# Patient Record
Sex: Female | Born: 1960
Health system: Southern US, Community
[De-identification: ages and names within clinical notes are randomized; demographics above are authoritative.]

## PROBLEM LIST (undated history)

## (undated) DIAGNOSIS — E119 Type 2 diabetes mellitus without complications: Secondary | ICD-10-CM

## (undated) DIAGNOSIS — I10 Essential (primary) hypertension: Secondary | ICD-10-CM

## (undated) DIAGNOSIS — E039 Hypothyroidism, unspecified: Secondary | ICD-10-CM

## (undated) DIAGNOSIS — F988 Other specified behavioral and emotional disorders with onset usually occurring in childhood and adolescence: Secondary | ICD-10-CM

## (undated) DIAGNOSIS — M1990 Unspecified osteoarthritis, unspecified site: Secondary | ICD-10-CM

## (undated) DIAGNOSIS — M5430 Sciatica, unspecified side: Secondary | ICD-10-CM

## (undated) DIAGNOSIS — J45909 Unspecified asthma, uncomplicated: Secondary | ICD-10-CM

## (undated) HISTORY — DX: Type 2 diabetes mellitus without complications: E11.9

## (undated) HISTORY — DX: Essential (primary) hypertension: I10

## (undated) HISTORY — DX: Unspecified asthma, uncomplicated: J45.909

## (undated) HISTORY — PX: KNEE SURGERY: SHX244

## (undated) HISTORY — DX: Unspecified osteoarthritis, unspecified site: M19.90

## (undated) HISTORY — DX: Hypothyroidism, unspecified: E03.9

## (undated) HISTORY — DX: Other specified behavioral and emotional disorders with onset usually occurring in childhood and adolescence: F98.8

---

## 2000-01-05 DIAGNOSIS — F988 Other specified behavioral and emotional disorders with onset usually occurring in childhood and adolescence: Secondary | ICD-10-CM

## 2000-01-05 HISTORY — DX: Other specified behavioral and emotional disorders with onset usually occurring in childhood and adolescence: F98.8

## 2002-01-04 DIAGNOSIS — J45909 Unspecified asthma, uncomplicated: Secondary | ICD-10-CM

## 2002-01-04 HISTORY — DX: Unspecified asthma, uncomplicated: J45.909

## 2004-01-05 DIAGNOSIS — M1990 Unspecified osteoarthritis, unspecified site: Secondary | ICD-10-CM

## 2004-01-05 HISTORY — PX: ENDOMETRIAL ABLATION: SHX621

## 2004-01-05 HISTORY — DX: Unspecified osteoarthritis, unspecified site: M19.90

## 2006-01-04 DIAGNOSIS — E119 Type 2 diabetes mellitus without complications: Secondary | ICD-10-CM

## 2006-01-04 DIAGNOSIS — E039 Hypothyroidism, unspecified: Secondary | ICD-10-CM

## 2006-01-04 HISTORY — DX: Type 2 diabetes mellitus without complications: E11.9

## 2006-01-04 HISTORY — DX: Hypothyroidism, unspecified: E03.9

## 2007-01-05 DIAGNOSIS — I10 Essential (primary) hypertension: Secondary | ICD-10-CM

## 2007-01-05 HISTORY — DX: Essential (primary) hypertension: I10

## 2013-09-05 ENCOUNTER — Ambulatory Visit: Payer: Self-pay | Attending: Family Medicine | Admitting: Family Medicine

## 2013-09-05 ENCOUNTER — Encounter: Payer: Self-pay | Admitting: Family Medicine

## 2013-09-05 VITALS — BP 128/77 | HR 74 | Temp 98.3°F | Resp 20 | Ht 69.0 in | Wt 250.0 lb

## 2013-09-05 DIAGNOSIS — E1142 Type 2 diabetes mellitus with diabetic polyneuropathy: Secondary | ICD-10-CM

## 2013-09-05 DIAGNOSIS — E1165 Type 2 diabetes mellitus with hyperglycemia: Secondary | ICD-10-CM

## 2013-09-05 DIAGNOSIS — F3289 Other specified depressive episodes: Secondary | ICD-10-CM

## 2013-09-05 DIAGNOSIS — IMO0002 Reserved for concepts with insufficient information to code with codable children: Secondary | ICD-10-CM

## 2013-09-05 DIAGNOSIS — R739 Hyperglycemia, unspecified: Secondary | ICD-10-CM

## 2013-09-05 DIAGNOSIS — F329 Major depressive disorder, single episode, unspecified: Secondary | ICD-10-CM

## 2013-09-05 DIAGNOSIS — E1140 Type 2 diabetes mellitus with diabetic neuropathy, unspecified: Secondary | ICD-10-CM | POA: Insufficient documentation

## 2013-09-05 DIAGNOSIS — F32A Depression, unspecified: Secondary | ICD-10-CM

## 2013-09-05 DIAGNOSIS — R7309 Other abnormal glucose: Secondary | ICD-10-CM

## 2013-09-05 DIAGNOSIS — Z113 Encounter for screening for infections with a predominantly sexual mode of transmission: Secondary | ICD-10-CM

## 2013-09-05 DIAGNOSIS — E1149 Type 2 diabetes mellitus with other diabetic neurological complication: Secondary | ICD-10-CM

## 2013-09-05 DIAGNOSIS — E039 Hypothyroidism, unspecified: Secondary | ICD-10-CM

## 2013-09-05 DIAGNOSIS — E119 Type 2 diabetes mellitus without complications: Secondary | ICD-10-CM

## 2013-09-05 DIAGNOSIS — E038 Other specified hypothyroidism: Secondary | ICD-10-CM | POA: Insufficient documentation

## 2013-09-05 DIAGNOSIS — Z0000 Encounter for general adult medical examination without abnormal findings: Secondary | ICD-10-CM | POA: Insufficient documentation

## 2013-09-05 DIAGNOSIS — E669 Obesity, unspecified: Secondary | ICD-10-CM

## 2013-09-05 DIAGNOSIS — F988 Other specified behavioral and emotional disorders with onset usually occurring in childhood and adolescence: Secondary | ICD-10-CM

## 2013-09-05 LAB — COMPLETE METABOLIC PANEL WITH GFR
ALT: 24 U/L (ref 0–35)
AST: 30 U/L (ref 0–37)
Albumin: 4 g/dL (ref 3.5–5.2)
Alkaline Phosphatase: 98 U/L (ref 39–117)
BILIRUBIN TOTAL: 0.9 mg/dL (ref 0.2–1.2)
BUN: 14 mg/dL (ref 6–23)
CO2: 22 meq/L (ref 19–32)
Calcium: 8.9 mg/dL (ref 8.4–10.5)
Chloride: 100 mEq/L (ref 96–112)
Creat: 1.05 mg/dL (ref 0.50–1.10)
GFR, EST NON AFRICAN AMERICAN: 61 mL/min
GFR, Est African American: 70 mL/min
GLUCOSE: 318 mg/dL — AB (ref 70–99)
Potassium: 3.6 mEq/L (ref 3.5–5.3)
Sodium: 135 mEq/L (ref 135–145)
Total Protein: 7 g/dL (ref 6.0–8.3)

## 2013-09-05 LAB — TSH: TSH: 8.846 u[IU]/mL — ABNORMAL HIGH (ref 0.350–4.500)

## 2013-09-05 LAB — LIPID PANEL
CHOLESTEROL: 167 mg/dL (ref 0–200)
HDL: 42 mg/dL (ref >39)
LDL Cholesterol: 98 mg/dL (ref 0–99)
TRIGLYCERIDES: 134 mg/dL (ref <150)
Total CHOL/HDL Ratio: 4 Ratio
VLDL: 27 mg/dL (ref 0–40)

## 2013-09-05 LAB — GLUCOSE, POCT (MANUAL RESULT ENTRY): POC Glucose: 336 mg/dl — AB (ref 70–99)

## 2013-09-05 LAB — POCT GLYCOSYLATED HEMOGLOBIN (HGB A1C): Hemoglobin A1C: 9.6

## 2013-09-05 MED ORDER — METFORMIN HCL ER 500 MG PO TB24: 1000.0000 mg | ORAL_TABLET | Freq: Every day | ORAL | Status: DC

## 2013-09-05 MED ORDER — GLIPIZIDE 10 MG PO TABS: 10.0000 mg | ORAL_TABLET | Freq: Two times a day (BID) | ORAL | Status: DC

## 2013-09-05 NOTE — Assessment & Plan Note (Signed)
A: previously on hormone replacement P: Check TSH

## 2013-09-05 NOTE — Assessment & Plan Note (Signed)
Screening HIV  

## 2013-09-05 NOTE — Progress Notes (Signed)
Establish Care Pt stated has DM and HTN not taking medication, out of medication for about 1 year

## 2013-09-05 NOTE — Patient Instructions (Signed)
Tanya Ball,  Thank you for coming in today. It was a pleasure meeting you. I look forward to be a primary doctor.  Regarding your diabetes:  Start metformin take 500 mg with supper for the first week, then increase to 1000 mg if tolerating well. Start glipizide 10 mg with meals.  Drink plenty of water   You will be contacted with labs results.   F/u with me in 2 weeks. Think about the flu shot.   Dr. Armen Pickup

## 2013-09-05 NOTE — Progress Notes (Signed)
   Subjective:    Patient ID: Tanya Ball, female    DOB: 1960-03-23, 53 y.o.   MRN: [id number] CC: establish care, diabetes, hypothyroidism, depression  HPI 53 yo F present to establish care and discuss the following:   1. Diabetes type 2:  dx in 2008. Untreated for past year. Never on insulin. Admits to tingling/numbess/sharp pains in feet. Admits to achy joints and muscles. Denies vision changes, HA, CP, SOB, GI upset.    2. Hypothyroidism: dx in 2009. Previously on synthroid. No history of ablation/thryoidectomy. Admits to weight gain. Denies edema, chills.   3. Depression: x one year. 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 Denies SI/HI. Previously with depression/ADD and treated with SSRI. Admits to poor sleep.   Soc hx: chronic smoker  Review of Systems As per HPI     Objective:   Physical Exam BP 128/77  Pulse 74  Temp(Src) 98.3 F (36.8 C) (Oral)  Resp 20  Ht  (1.753 m)  Wt 250 lb (113.399 kg)  BMI 36.90 kg/m2  SpO2 100% General appearance: alert, cooperative, no distress and moderately obese Head: Normocephalic, without obvious abnormality, atraumatic Eyes: conjunctivae/corneas clear. PERRL, EOM's intact.  Ears: normal TM's and external ear canals both ears Nose: Nares normal. Septum midline. Mucosa normal. No drainage or sinus tenderness. Throat: lips, mucosa, and tongue normal; teeth and gums normal Neck: no adenopathy, supple, symmetrical, trachea midline and thyroid not enlarged, symmetric, no tenderness/mass/nodules Lungs: clear to auscultation bilaterally Heart: regular rate and rhythm, S1, S2 normal, no murmur, click, rub or gallop Extremities: edema trace   Lab Results  Component Value Date   HGBA1C 9.6 09/05/2013       Assessment & Plan:

## 2013-09-06 ENCOUNTER — Telehealth: Payer: Self-pay | Admitting: Family Medicine

## 2013-09-06 DIAGNOSIS — E1165 Type 2 diabetes mellitus with hyperglycemia: Secondary | ICD-10-CM

## 2013-09-06 DIAGNOSIS — E039 Hypothyroidism, unspecified: Secondary | ICD-10-CM

## 2013-09-06 DIAGNOSIS — E1140 Type 2 diabetes mellitus with diabetic neuropathy, unspecified: Secondary | ICD-10-CM

## 2013-09-06 DIAGNOSIS — IMO0002 Reserved for concepts with insufficient information to code with codable children: Secondary | ICD-10-CM

## 2013-09-06 LAB — HIV ANTIBODY (ROUTINE TESTING W REFLEX): HIV 1&2 Ab, 4th Generation: NONREACTIVE

## 2013-09-06 LAB — MICROALBUMIN / CREATININE URINE RATIO
Creatinine, Urine: 209.6 mg/dL
MICROALB/CREAT RATIO: 27.3 mg/g (ref 0.0–30.0)
Microalb, Ur: 5.72 mg/dL — ABNORMAL HIGH (ref 0.00–1.89)

## 2013-09-06 MED ORDER — ATORVASTATIN CALCIUM 10 MG PO TABS: 10.0000 mg | ORAL_TABLET | Freq: Every day | ORAL | Status: DC

## 2013-09-06 MED ORDER — LISINOPRIL 10 MG PO TABS: 10.0000 mg | ORAL_TABLET | Freq: Every day | ORAL | Status: DC

## 2013-09-06 NOTE — Telephone Encounter (Signed)
Patient called Elevated TSH consistent with hypothyroidism. Please return for f/u blood draw of free T4 and T3. If low as suspected we will initiate thyroid replacement therapy. Normal CMP except elevated blood sugar as expected.  HIV negative. Lipids normal, moderate intensity statin recommended while getting diabetes under control to lessen risk of heart attack and stroke, lipitor 10 sent to onsite pharmacy.  Normal albumin/Creatinine ratio, plan to start ACE i at f/u to keep kidneys protected while getting diabetes under control.    Patient reports sharp pain shooting through her head, extremely painful, lasted a few minutes. Resolved.

## 2013-09-06 NOTE — Assessment & Plan Note (Signed)
A: positive screen in the setting of untreated DM2 and ADD P: Treat endocrine disorder Referral to psychology Discuss restarting SSRI at f/u

## 2013-09-06 NOTE — Assessment & Plan Note (Signed)
A: uncontrolled, due to lack of treatment P: Metformin Glipizide Liberal intake of water Lipids plan for statin BMP and urine ACR plan for ACEi Foot exam done today. Will need eye exam

## 2013-09-12 ENCOUNTER — Ambulatory Visit: Payer: Self-pay | Attending: Family Medicine

## 2013-09-12 DIAGNOSIS — E039 Hypothyroidism, unspecified: Secondary | ICD-10-CM

## 2013-09-13 ENCOUNTER — Telehealth: Payer: Self-pay | Admitting: Family Medicine

## 2013-09-13 DIAGNOSIS — E039 Hypothyroidism, unspecified: Secondary | ICD-10-CM

## 2013-09-13 DIAGNOSIS — E038 Other specified hypothyroidism: Secondary | ICD-10-CM

## 2013-09-13 LAB — T4, FREE: Free T4: 0.89 ng/dL (ref 0.80–1.80)

## 2013-09-13 LAB — T3: T3, Total: 108.8 ng/dL (ref 80.0–204.0)

## 2013-09-13 NOTE — Assessment & Plan Note (Addendum)
A: elevated TSH, normal free T4 an dT3.  P:  Discuss initiating synthroid with  patient, she is in a gray area in terms of recommendations.

## 2013-09-13 NOTE — Telephone Encounter (Signed)
Called patient. Left VM. Elevated TS, normal free T4 and T3, subclinical hypothyroid. Would lean towards initiating synthroid was like to discuss with patient first. As patient to call back to discuss this with me over the phone or preferably call for an appointment to discuss in the office.

## 2013-09-13 NOTE — Progress Notes (Signed)
Patient ID: Tanya Ball, female   DOB: 10-04-1960, 53 y.o.   MRN: [id number]

## 2013-09-20 ENCOUNTER — Encounter: Payer: Self-pay | Admitting: Family Medicine

## 2013-09-20 ENCOUNTER — Ambulatory Visit: Payer: Self-pay | Admitting: Family Medicine

## 2013-09-20 ENCOUNTER — Ambulatory Visit: Payer: Self-pay | Attending: Family Medicine | Admitting: Family Medicine

## 2013-09-20 VITALS — BP 129/74 | HR 82 | Temp 98.4°F | Resp 18 | Ht 69.5 in | Wt 251.0 lb

## 2013-09-20 DIAGNOSIS — R739 Hyperglycemia, unspecified: Secondary | ICD-10-CM

## 2013-09-20 DIAGNOSIS — E1165 Type 2 diabetes mellitus with hyperglycemia: Secondary | ICD-10-CM

## 2013-09-20 DIAGNOSIS — E1142 Type 2 diabetes mellitus with diabetic polyneuropathy: Secondary | ICD-10-CM | POA: Insufficient documentation

## 2013-09-20 DIAGNOSIS — E039 Hypothyroidism, unspecified: Secondary | ICD-10-CM | POA: Insufficient documentation

## 2013-09-20 DIAGNOSIS — Z87891 Personal history of nicotine dependence: Secondary | ICD-10-CM | POA: Insufficient documentation

## 2013-09-20 DIAGNOSIS — E1140 Type 2 diabetes mellitus with diabetic neuropathy, unspecified: Secondary | ICD-10-CM

## 2013-09-20 DIAGNOSIS — E038 Other specified hypothyroidism: Secondary | ICD-10-CM

## 2013-09-20 DIAGNOSIS — R7309 Other abnormal glucose: Secondary | ICD-10-CM

## 2013-09-20 DIAGNOSIS — E1149 Type 2 diabetes mellitus with other diabetic neurological complication: Secondary | ICD-10-CM | POA: Insufficient documentation

## 2013-09-20 DIAGNOSIS — IMO0002 Reserved for concepts with insufficient information to code with codable children: Secondary | ICD-10-CM

## 2013-09-20 LAB — GLUCOSE, POCT (MANUAL RESULT ENTRY): POC GLUCOSE: 204 mg/dL — AB (ref 70–99)

## 2013-09-20 MED ORDER — LEVOTHYROXINE SODIUM 75 MCG PO TABS: 75.0000 ug | ORAL_TABLET | Freq: Every day | ORAL | Status: DC

## 2013-09-20 MED ORDER — GLIPIZIDE 10 MG PO TABS: 5.0000 mg | ORAL_TABLET | Freq: Two times a day (BID) | ORAL | Status: DC

## 2013-09-20 MED ORDER — ACCU-CHEK FASTCLIX LANCETS MISC: 1.0000 | Freq: Three times a day (TID) | Status: DC

## 2013-09-20 MED ORDER — ACCU-CHEK NANO SMARTVIEW W/DEVICE KIT: 1.0000 | PACK | Status: DC | PRN

## 2013-09-20 MED ORDER — GLUCOSE BLOOD VI STRP: 1.0000 | ORAL_STRIP | Freq: Every day | Status: DC

## 2013-09-20 NOTE — Progress Notes (Signed)
   Subjective:    Patient ID: Tanya Ball, female    DOB: [birth year], 53 y.o.   MRN: [id number] CC: f/u DM2, f/u subclinical hypothyroidism  HPI The 53 year old female presents for followup visit discussed the following:  #1 diabetes type 2:  Compress metformin. Had jitteriness and GI upset when taking glipizide. Did not check her blood sugar as she did have a meter.  no polyuria or polydipsia. Has pain in her lower extremities.  #2  subclinical hypothyroidism: Thyroid studies support subclinical hypothyroidism. Patient reports symptoms of fatigue, intermittent constipation, aches in her lower legs. Patient would like to restart level thyroxine.  Soc hx: former smoker  Review of Systems As per HPI     Objective:   Physical Exam BP 129/74  Pulse 82  Temp(Src) 98.4 F (36.9 C) (Oral)  Resp 18  Ht 5' 9.5" (1.765 m)  Wt 251 lb (113.853 kg)  BMI 36.55 kg/m2  SpO2 99% General appearance: alert, cooperative and no distress Neck: no adenopathy, supple, symmetrical, trachea midline and thyroid not enlarged, symmetric, no tenderness/mass/nodules Extremities: extremities normal, atraumatic, no cyanosis or edema     Assessment & Plan:

## 2013-09-20 NOTE — Progress Notes (Signed)
F/U DM HTN Stated after taking Glucotrol was light header, shaky

## 2013-09-20 NOTE — Assessment & Plan Note (Signed)
A: symptoms include fatigue, constipation, muscle ache in legs. Dicussed with patient and she would like to start synthroid P: Start levothyroxine 75 mcg daily Repeat TSH in 8-12 weeks

## 2013-09-20 NOTE — Patient Instructions (Signed)
Mrs. Arkin,  Thank you for coming back in to see me today.  1. For diabetes: sugars are coming down. Prescribed meter Continue glipizide 5 mg BID,   Glipizide does contain sulfa, so I spoke to the pharmacist Rash with sulfa antibiotics-likely side effect and not allergy. Can often tolerate sulfa meds including sulfonylureas and lasix. Rash and anaphylasix/angiodema to sulfa antibiotics likely a true allergy, avoid all sulfa drugs.    2. For subclinical hypothyroidism: Start sythroid F/u in 8-12 weeks for repeat TSH/DM2 f/u   Dr. Armen Pickup

## 2013-09-20 NOTE — Assessment & Plan Note (Addendum)
A: improved CBG. Subjective hypoglycemia with glipizide.  P: Decrease glipizide to 5 U BID Continue metformin Prescribed meter

## 2013-11-15 ENCOUNTER — Ambulatory Visit: Payer: Self-pay | Admitting: Family Medicine

## 2013-11-26 ENCOUNTER — Ambulatory Visit: Payer: Self-pay

## 2015-07-08 ENCOUNTER — Encounter (HOSPITAL_COMMUNITY): Payer: Self-pay | Admitting: Emergency Medicine

## 2015-07-08 ENCOUNTER — Emergency Department (HOSPITAL_COMMUNITY)
Admission: EM | Admit: 2015-07-08 | Discharge: 2015-07-08 | Disposition: A | Discharge status: Home / Self Care | Payer: Self-pay | Attending: Emergency Medicine | Admitting: Emergency Medicine

## 2015-07-08 DIAGNOSIS — M79604 Pain in right leg: Secondary | ICD-10-CM

## 2015-07-08 DIAGNOSIS — J45909 Unspecified asthma, uncomplicated: Secondary | ICD-10-CM | POA: Insufficient documentation

## 2015-07-08 DIAGNOSIS — Z79899 Other long term (current) drug therapy: Secondary | ICD-10-CM | POA: Insufficient documentation

## 2015-07-08 DIAGNOSIS — Y9301 Activity, walking, marching and hiking: Secondary | ICD-10-CM | POA: Insufficient documentation

## 2015-07-08 DIAGNOSIS — Y990 Civilian activity done for income or pay: Secondary | ICD-10-CM | POA: Insufficient documentation

## 2015-07-08 DIAGNOSIS — Y929 Unspecified place or not applicable: Secondary | ICD-10-CM | POA: Insufficient documentation

## 2015-07-08 DIAGNOSIS — I10 Essential (primary) hypertension: Secondary | ICD-10-CM | POA: Insufficient documentation

## 2015-07-08 DIAGNOSIS — M25561 Pain in right knee: Secondary | ICD-10-CM | POA: Insufficient documentation

## 2015-07-08 DIAGNOSIS — E119 Type 2 diabetes mellitus without complications: Secondary | ICD-10-CM | POA: Insufficient documentation

## 2015-07-08 DIAGNOSIS — Z7984 Long term (current) use of oral hypoglycemic drugs: Secondary | ICD-10-CM | POA: Insufficient documentation

## 2015-07-08 DIAGNOSIS — M79661 Pain in right lower leg: Secondary | ICD-10-CM | POA: Insufficient documentation

## 2015-07-08 DIAGNOSIS — M25562 Pain in left knee: Secondary | ICD-10-CM | POA: Insufficient documentation

## 2015-07-08 DIAGNOSIS — Z87891 Personal history of nicotine dependence: Secondary | ICD-10-CM | POA: Insufficient documentation

## 2015-07-08 DIAGNOSIS — X58XXXA Exposure to other specified factors, initial encounter: Secondary | ICD-10-CM | POA: Insufficient documentation

## 2015-07-08 LAB — CBG MONITORING, ED: Glucose-Capillary: 252 mg/dL — ABNORMAL HIGH (ref 65–99)

## 2015-07-08 NOTE — ED Provider Notes (Signed)
CSN: [id number]     Arrival date & time 07/08/15  0848 History   First MD Initiated Contact with Patient 07/08/15 [phone]     Chief Complaint  Patient presents with  . Leg Pain     (Consider location/radiation/quality/duration/timing/severity/associated sxs/prior Treatment) HPI Comments: 55 year old female with past medical history including hypertension and type 2 diabetes mellitus who presents with right leg pain. The patient reports a long history of bilateral knee and shin pain which occurs daily and is worse after she has walked around at work. This morning, she sat down after walking and had a sudden onset of right lower leg pain. She felt like she "lost strength" in her leg because of the severity of the pain. She denies any new numbness in her legs, saddle anesthesia, or bowel/bladder incontinence. She reports noticing some lower back pain after the leg pain began. She reports that the leg pain was in different places all over her leg and she cannot pinpoint one specific area. No history of blood clots, history of cancer, recent travel, or OCP use.  Of note, the patient has been off of her chronic medications for almost 3 years. She states that she has not been able to afford to follow-up or get her prescriptions.  Patient is a 55 y.o. female presenting with leg pain. The history is provided by the patient.  Leg Pain   Past Medical History  Diagnosis Date  . ADD (attention deficit disorder) 2002  . Arthritis 2006    knees, fingers   . Asthma 2004  . Hypothyroidism 2008     previously on levothyroxine   . Diabetes mellitus without complication ([place]) 8841  . Hypertension 2009   Past Surgical History  Procedure Laterality Date  . Knee surgery  2012, 2004     both knees, meniscus surgery   . Endometrial ablation  2006    Family History  Problem Relation Age of Onset  . Diabetes Mother   . Liver disease Father   . Cancer Brother     Hodgkin's Lymphoma   . Aneurysm Maternal  Grandmother 58   Social History  Substance Use Topics  . Smoking status: Former Research scientist (life sciences)  . Smokeless tobacco: Never Used  . Alcohol Use: No   OB History    No data available     Review of Systems 10 Systems reviewed and are negative for acute change except as noted in the HPI.   Allergies  Iodine; Macrobid; and Sulfa antibiotics  Home Medications   Prior to Admission medications   Medication Sig Start Date End Date Taking? Authorizing Provider  ACCU-CHEK FASTCLIX LANCETS MISC 1 each by Does not apply route 3 (three) times daily. 09/20/13   Josalyn Funches, MD  atorvastatin (LIPITOR) 10 MG tablet Take 1 tablet (10 mg total) by mouth daily. 09/06/13   Josalyn Funches, MD  Blood Glucose Monitoring Suppl (ACCU-CHEK NANO SMARTVIEW) W/DEVICE KIT 1 Device by Does not apply route as needed. 09/20/13   Josalyn Funches, MD  glipiZIDE (GLUCOTROL) 10 MG tablet Take 0.5 tablets (5 mg total) by mouth 2 (two) times daily before a meal. 09/20/13   Josalyn Funches, MD  glucose blood (ACCU-CHEK SMARTVIEW) test strip 1 each by Other route 5 (five) times daily. 09/20/13   Josalyn Funches, MD  levothyroxine (SYNTHROID, LEVOTHROID) 75 MCG tablet Take 1 tablet (75 mcg total) by mouth daily. 09/20/13   Josalyn Funches, MD  lisinopril (PRINIVIL,ZESTRIL) 10 MG tablet Take 1 tablet (10 mg total) by mouth  daily. 09/06/13   Boykin Nearing, MD  metFORMIN (GLUCOPHAGE XR) 500 MG 24 hr tablet Take 2 tablets (1,000 mg total) by mouth daily with supper. 09/05/13   Josalyn Funches, MD   BP 136/97 mmHg  Pulse 68  Temp(Src) 98.2 F (36.8 C) (Oral)  Resp 16  SpO2 96% Physical Exam  Constitutional: She is oriented to person, place, and time. She appears well-developed and well-nourished. No distress.  HENT:  Head: Normocephalic and atraumatic.  Eyes: Conjunctivae are normal.  Neck: Neck supple.  Cardiovascular: Normal rate, regular rhythm, normal heart sounds and intact distal pulses.   No murmur heard. Pulmonary/Chest:  Effort normal and breath sounds normal.  Abdominal: Soft. Bowel sounds are normal. She exhibits no distension. There is no tenderness.  Musculoskeletal: She exhibits no edema or tenderness.  Neurological: She is alert and oriented to person, place, and time. She exhibits normal muscle tone.  Fluent speech 5/5 strength and normal sensation BLE  Skin: Skin is warm and dry. No rash noted. No erythema.  Psychiatric: She has a normal mood and affect. Judgment normal.  Nursing note and vitals reviewed.   ED Course  Procedures (including critical care time) Labs Review Labs Reviewed  CBG MONITORING, ED - Abnormal; Notable for the following:    Glucose-Capillary 252 (*)    All other components within normal limits    Imaging Review No results found. I have personally reviewed and evaluated these lab results as part of my medical decision-making.   EKG Interpretation None      MDM   Final diagnoses:  Right leg pain  Bilateral knee pain   Patient with long history of daily knee and shin pain in both legs presents with an acute worsening of her right leg pain while at work today. She was neurovascularly intact on exam with no obvious leg swelling or focal tenderness. She had normal strength and sensation bilaterally. She has no risk factors for DVT. No recent trauma. Patient stated later that she often has leg pain when she lays down and has chronic numbness and tingling of her toes. It is possible that she has diabetic neuropathy as she has not been controlling her diabetes with medication. Given no trauma or abnormality on exam I do not feel she needs any imaging at this time. I extensively counseled patient on the importance of follow-up with PCP for diabetes and high blood pressure management. Reviewed diabetes diet. Obtained CBG here which was 252. Patient otherwise well-appearing. Discussed supportive care and need for follow-up. Patient voiced understanding and was discharged in  satisfactory condition.  Sharlett Iles, MD 07/08/15 [phone]

## 2015-07-08 NOTE — ED Notes (Signed)
Pt reports she has not taken any of her prescribed medications in "months" because she cannot afford them.

## 2015-07-08 NOTE — ED Notes (Signed)
Pt to ER by private vehicle with complaint of sudden onset right lower leg pain at 5 am this morning while working. 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 Pt states she has never had this pain before and "lost strength" in her leg. Pt strength equal in all extremities. No neuro deficits noted. Pulses present and equal in bilateral lower extremities. A/o x4. NAD at this time.

## 2015-07-08 NOTE — Discharge Instructions (Signed)
ESTABLISH CARE WITH A PRIMARY CARE PROVIDER AS SOON AS POSSIBLE FOR DIABETES, HYPERTENSION MANAGEMENT.  AllstateCommunity Resource Guide Financial Assistance The United Ways 211 is a great source of information about community services available.  Access by dialing 2-1-1 from anywhere in [place], or by website -  PooledIncome.plwww.nc211.org.   Other Local Resources (Updated 01/2015)  Financial Assistance   Services    Phone Number and Address  [facility]  Low-cost medical care - 1st and 3rd Saturday of every month  Must not qualify for public or private insurance and must have limited income [phone] [address] [place], [place]    [place] [employer] of Social Services  Child care  Emergency assistance for housing and Kimberly-Clarkutilities  Food stamps  Medicaid [phone] [address] [place], [place] [address]   [facility]  Low-cost medical care for children, communicable diseases, sexually-transmitted diseases, immunizations, maternity care, womens health and family planning [phone] [address] [place], [place] [address]  [facility]   Medication assistance for [facility] [place] residents  Must meet income requirements [phone] [address] [place], [place].    [facility]  Child care  Emergency assistance for housing and Kimberly-Clarkutilities  Food stamps  Medicaid [phone] [address] [place], [place] [address]  [facility]   Low-cost medical care,   Monday through Friday, 9 am to 6 pm.   Accepts Medicare/Medicaid, and self-pay [phone] [address] [place], [place] [address]  [facility]  Low-cost medical care - Monday through Friday, 8:30 am - 5:30 pm  Accepts Medicaid and self-pay [phone] [address] [place], [place] [address]   [facility]  Primary medical care, including for those with sickle cell disease  Accepts Medicare, Medicaid, insurance and self-pay [phone] [address] [place], [place]  [facility]   Primary medical care  Accepts Medicare, [place], insurance and self-pay [phone] [address] [place], [place] [address]   [facility] Department of Social Services  Child care  Emergency assistance for housing and Kimberly-Clarkutilities  Food stamps  Medicaid [phone] [address] [place], [place] [address]  [facility] of Health and CarMaxHuman Services  Child care  Emergency assistance for housing and Kimberly-Clarkutilities  Food stamps  Medicaid [phone] [address] [place], [place] [address]   [facility]  Medication assistance for [facility] residents with no insurance only  Must have a primary care doctor [phone] [address], [address] [place], [place]  [facility]   Primary medical care  [place] Medicare, [place]icaid, insurance  [phone] [address] [place], [place]  MedAssist   Medication assistance [phone]  [facility]   Primary medical care  Accepts Medicare, [place]icaid, insurance and self-pay [phone] [address] [place], [place] [address]  [facility]   Primary medical care  Accepts Medicare, [place]icaid, insurance and self-pay [phone] [address] [place], [place] [address]  [facility]  For [place] residents between the ages of 4618 and 6164 who do not have any form of health insurance, Medicare, [place]icaid, or [place] benefits.  Services are provided free of charge to uninsured patients who fall within federal poverty guidelines.    Hours: Tuesdays and Thursdays, 4:15 - 8 pm [phone] [address], [address]  [place], [place] [address]  [facility]      Primary medical care  Dental care  Nutritional counseling  Pharmacy  Accepts Medicaid, Medicare, most insurance.  Fees are adjusted based on ability to pay.   [phone] [facility] [address] [place], [place]  [phone] [facility] [address] [place], [place]  [phone] [facility] [place], [place]  [phone] [facility], [address] [place], [place]  [phone] [facility] [address] [place], [place]  Planned Parenthood  Womens health and family planning [phone] [address] [place], [place]  [facility] [place] Department of Social Services  Child care  Emergency assistance for housing and Kimberly-Clarkutilities  Food stamps  Medicaid [phone] [address], [place], [place] [address]   [facility]    Ages 1118 and older  Hours: Mondays and Thursdays, 7:00 am - 9:00 am Patients are seen on a first come, first served basis. [phone] [address] [place], [place]  [facility] Division of Social Services  Child care  Emergency assistance for housing and Kimberly-Clarkutilities  Food stamps  Medicaid [phone] [address] [place], [place] [address]  [facility] [employer] [employer]  Medication assistance  Rental assistance  Food pantry  Medication assistance  Housing assistance  Emergency food distribution  Utility assistance [phone] [address] [place], [place]  [phone]  [address] [place], [place] [address] Hours: Tuesdays and Thursdays from 9am - 12 noon by appointment only  [phone] [place], [place] [address]  [facility] - Lanae Boastlara F. Gunn   Accepts private insurance, [place] IslandMedicare, and [place]Medicaid.  Payment is based on a sliding scale for those without insurance.  Hours: Mondays, Tuesdays and Thursdays, 8:30 am - 5:30 pm.    [phone] [address] [place], [place]  [facility] - Family Medicine at [facility]    Accepts private insurance, [place] Island[place]icare, and [place]icaid.  Payment is based on a sliding scale for those without insurance. [phone] [address] [place], [place]  [facility] - Pediatrics at E. Scientist, research (physical sciences)[place]  Accepts private insurance, Harrah's EntertainmentMedicare, and [place]icaid.  Payment is based on a sliding scale for those without insurance [phone] [address], [place], [place]  [facility] - Pediatrics at [place]  Accepts private insurance, [place] LakesMedicare, and [place]Medicaid.  Payment is based on a sliding scale for those without insurance. [phone] [address] [place], [place]  [facility] - Pediatrics at [facility]  Accepts private insurance, [place] IslandMedicare, and [place]Medicaid.  Payment is based on a sliding scale for those without insurance. [phone] [address] [place], [place].    [facility]  Maternity care.  Accepts Medicaid and self-pay. [phone] [address] [place], [place]

## 2015-07-22 ENCOUNTER — Encounter (HOSPITAL_COMMUNITY): Payer: Self-pay | Admitting: Emergency Medicine

## 2015-07-22 ENCOUNTER — Emergency Department (HOSPITAL_COMMUNITY)
Admission: EM | Admit: 2015-07-22 | Discharge: 2015-07-23 | Disposition: A | Discharge status: Home / Self Care | Payer: Self-pay | Attending: Emergency Medicine | Admitting: Emergency Medicine

## 2015-07-22 DIAGNOSIS — Z791 Long term (current) use of non-steroidal anti-inflammatories (NSAID): Secondary | ICD-10-CM | POA: Insufficient documentation

## 2015-07-22 DIAGNOSIS — J45909 Unspecified asthma, uncomplicated: Secondary | ICD-10-CM | POA: Insufficient documentation

## 2015-07-22 DIAGNOSIS — Z87891 Personal history of nicotine dependence: Secondary | ICD-10-CM | POA: Insufficient documentation

## 2015-07-22 DIAGNOSIS — Z9114 Patient's other noncompliance with medication regimen: Secondary | ICD-10-CM

## 2015-07-22 DIAGNOSIS — R739 Hyperglycemia, unspecified: Secondary | ICD-10-CM

## 2015-07-22 DIAGNOSIS — I10 Essential (primary) hypertension: Secondary | ICD-10-CM | POA: Insufficient documentation

## 2015-07-22 DIAGNOSIS — E1165 Type 2 diabetes mellitus with hyperglycemia: Secondary | ICD-10-CM | POA: Insufficient documentation

## 2015-07-22 DIAGNOSIS — E039 Hypothyroidism, unspecified: Secondary | ICD-10-CM | POA: Insufficient documentation

## 2015-07-22 DIAGNOSIS — M19049 Primary osteoarthritis, unspecified hand: Secondary | ICD-10-CM | POA: Insufficient documentation

## 2015-07-22 DIAGNOSIS — Z7984 Long term (current) use of oral hypoglycemic drugs: Secondary | ICD-10-CM | POA: Insufficient documentation

## 2015-07-22 DIAGNOSIS — R42 Dizziness and giddiness: Secondary | ICD-10-CM

## 2015-07-22 DIAGNOSIS — F909 Attention-deficit hyperactivity disorder, unspecified type: Secondary | ICD-10-CM | POA: Insufficient documentation

## 2015-07-22 HISTORY — DX: Sciatica, unspecified side: M54.30

## 2015-07-22 LAB — BASIC METABOLIC PANEL
Anion gap: 8 (ref 5–15)
BUN: 13 mg/dL (ref 6–20)
CHLORIDE: 101 mmol/L (ref 101–111)
CO2: 27 mmol/L (ref 22–32)
CREATININE: 0.9 mg/dL (ref 0.44–1.00)
Calcium: 9.4 mg/dL (ref 8.9–10.3)
GFR calc non Af Amer: >60 mL/min (ref >60)
Glucose, Bld: 276 mg/dL — ABNORMAL HIGH (ref 65–99)
Potassium: 3.6 mmol/L (ref 3.5–5.1)
Sodium: 136 mmol/L (ref 135–145)

## 2015-07-22 LAB — CBC WITH DIFFERENTIAL/PLATELET
Basophils Absolute: 0 10*3/uL (ref 0.0–0.1)
Basophils Relative: 0 %
Eosinophils Absolute: 0.2 10*3/uL (ref 0.0–0.7)
Eosinophils Relative: 2 %
HEMATOCRIT: 43.6 % (ref 36.0–46.0)
HEMOGLOBIN: 14.8 g/dL (ref 12.0–15.0)
LYMPHS ABS: 1.7 10*3/uL (ref 0.7–4.0)
LYMPHS PCT: 18 %
MCH: 28.4 pg (ref 26.0–34.0)
MCHC: 33.9 g/dL (ref 30.0–36.0)
MCV: 83.5 fL (ref 78.0–100.0)
MONOS PCT: 6 %
Monocytes Absolute: 0.5 10*3/uL (ref 0.1–1.0)
NEUTROS ABS: 7.2 10*3/uL (ref 1.7–7.7)
NEUTROS PCT: 74 %
Platelets: 179 10*3/uL (ref 150–400)
RBC: 5.22 MIL/uL — ABNORMAL HIGH (ref 3.87–5.11)
RDW: 12.7 % (ref 11.5–15.5)
WBC: 9.6 10*3/uL (ref 4.0–10.5)

## 2015-07-22 MED ORDER — SODIUM CHLORIDE 0.9 % IV BOLUS (SEPSIS)
1000.0000 mL | Freq: Once | INTRAVENOUS | Status: AC
  Administered 2015-07-22: 1000 mL via INTRAVENOUS

## 2015-07-22 NOTE — ED Notes (Signed)
Bed: WA08 Expected date:  Expected time:  Means of arrival:  Comments: Hyperglycemia

## 2015-07-22 NOTE — ED Notes (Signed)
Bed: WA08 Expected date:  Expected time:  Means of arrival:  Comments: EMS hyperglycemia / dizziness

## 2015-07-22 NOTE — ED Notes (Signed)
Two unsuccessful IV attempts.

## 2015-07-22 NOTE — ED Provider Notes (Signed)
CSN: [id number]     Arrival date & time 07/22/15  2122 History  By signing my name below, I, Tanya Ball, attest that this documentation has been prepared under the direction and in the presence of Tanya Creamer, NP. Electronically Signed: Randa Ball, ED Scribe. 07/22/2015. 9:38 PM.      Chief Complaint  Patient presents with  . Hyperglycemia   The history is provided by the patient. No language interpreter was used.   HPI Comments: Tanya Ball is a 55 y.o. female brought in by ambulance, who presents to the Emergency Department complaining of dizziness onset tonight. Pt reports associated nausea. Pt states that her symptoms began while she was standing, hovering over dog. She states that during the onset of her symptoms she was diaphoretic as well. Pt states she tried drinking some water with no relief.  Pt also reports that her highest CBG today was in the 300s today. Pt states that she is non compliant with her medications due to her finances.  Pt doesn't report any other symptoms at this time.    Past Medical History  Diagnosis Date  . ADD (attention deficit disorder) 2002  . Arthritis 2006    knees, fingers   . Asthma 2004  . Hypothyroidism 2008     previously on levothyroxine   . Diabetes mellitus without complication ([place]) 4098  . Hypertension 2009  . Sciatica    Past Surgical History  Procedure Laterality Date  . Knee surgery  2012, 2004     both knees, meniscus surgery   . Endometrial ablation  2006    Family History  Problem Relation Age of Onset  . Diabetes Mother   . Liver disease Father   . Cancer Brother     Hodgkin's Lymphoma   . Aneurysm Maternal Grandmother 18   Social History  Substance Use Topics  . Smoking status: Former Research scientist (life sciences)  . Smokeless tobacco: Never Used  . Alcohol Use: No   OB History    No data available     Review of Systems  Constitutional: Positive for diaphoresis.  Gastrointestinal: Positive for nausea.   Neurological: Positive for dizziness.  All other systems reviewed and are negative.     Allergies  Iodine; Macrobid; and Sulfa antibiotics  Home Medications   Prior to Admission medications   Medication Sig Start Date End Date Taking? Authorizing Provider  naproxen sodium (ANAPROX) 220 MG tablet Take 220 mg by mouth 2 (two) times daily with a meal.   Yes Historical Provider, MD  ACCU-CHEK FASTCLIX LANCETS MISC 1 each by Does not apply route 3 (three) times daily. 09/20/13   Josalyn Funches, MD  atorvastatin (LIPITOR) 10 MG tablet Take 1 tablet (10 mg total) by mouth daily. Patient not taking: Reported on 07/22/2015 09/06/13   Boykin Nearing, MD  Blood Glucose Monitoring Suppl (ACCU-CHEK NANO SMARTVIEW) W/DEVICE KIT 1 Device by Does not apply route as needed. 09/20/13   Josalyn Funches, MD  glipiZIDE (GLUCOTROL) 10 MG tablet Take 0.5 tablets (5 mg total) by mouth 2 (two) times daily before a meal. Patient not taking: Reported on 07/22/2015 09/20/13   Adriana Mccallum Funches, MD  glucose blood (ACCU-CHEK SMARTVIEW) test strip 1 each by Other route 5 (five) times daily. 09/20/13   Josalyn Funches, MD  levothyroxine (SYNTHROID, LEVOTHROID) 75 MCG tablet Take 1 tablet (75 mcg total) by mouth daily. Patient not taking: Reported on 07/22/2015 09/20/13   Boykin Nearing, MD  lisinopril (PRINIVIL,ZESTRIL) 10 MG tablet Take 1 tablet (10  mg total) by mouth daily. Patient not taking: Reported on 07/22/2015 09/06/13   Boykin Nearing, MD  metFORMIN (GLUCOPHAGE) 850 MG tablet Take 1 tablet (850 mg total) by mouth daily with breakfast. 07/23/15   Tanya Creamer, NP   BP 152/75 mmHg  Pulse 62  Temp(Src) 98.7 F (37.1 C)  Resp 18  SpO2 98%   Physical Exam  Constitutional: She is oriented to person, place, and time. She appears well-developed and well-nourished. No distress.  HENT:  Head: Normocephalic and atraumatic.  Eyes: Conjunctivae and EOM are normal.  Neck: Neck supple. No tracheal deviation present.   Cardiovascular: Normal rate.   Pulmonary/Chest: Effort normal. No respiratory distress.  Musculoskeletal: Normal range of motion.  Neurological: She is alert and oriented to person, place, and time.  Skin: Skin is warm and dry.  Psychiatric: She has a normal mood and affect. Her behavior is normal.  Nursing note and vitals reviewed.   ED Course  Procedures (including critical care time) DIAGNOSTIC STUDIES: Oxygen Saturation is 96% on RA, normal by my interpretation.    COORDINATION OF CARE: 9:44 PM-Discussed treatment plan which includes CBG with pt at bedside and pt agreed to plan.     Labs Review Labs Reviewed  CBC WITH DIFFERENTIAL/PLATELET - Abnormal; Notable for the following:    RBC 5.22 (*)    All other components within normal limits  BASIC METABOLIC PANEL - Abnormal; Notable for the following:    Glucose, Bld 276 (*)    All other components within normal limits    Imaging Review No results found.    EKG Interpretation None    Patient was given IV fluids.  Her blood sugar reduced to 228.  She states she feels better.  Discussed importance of filling her prescription.  She states that she will be able to fill her prescription [facility].  [facility] has been given prescription for metformin 875 mg daily with dietary guidelines  MDM   Final diagnoses:  Hyperglycemia  H/O medication noncompliance  Dizzy       I personally performed the services described in this documentation, which was scribed in my presence. The recorded information has been reviewed and is accurate.     Tanya Creamer, NP 07/23/15 0128  Forde Dandy, MD 07/23/15 [phone]

## 2015-07-22 NOTE — ED Notes (Signed)
Pt ambulated to restroom. 

## 2015-07-22 NOTE — ED Notes (Signed)
Pt BIB EMS from home; pt c/o dizziness and hyperglycemia; CBG 317; pt is noncompliant with meds d/t financial constraints; also c/o nausea; zofran given by EMS.

## 2015-07-23 LAB — CBG MONITORING, ED: GLUCOSE-CAPILLARY: 228 mg/dL — AB (ref 65–99)

## 2015-07-23 MED ORDER — METFORMIN HCL 850 MG PO TABS
850.0000 mg | ORAL_TABLET | Freq: Once | ORAL | Status: AC
  Administered 2015-07-23: 850 mg via ORAL
  Filled 2015-07-23: qty 1

## 2015-07-23 MED ORDER — METFORMIN HCL 850 MG PO TABS: 850.0000 mg | ORAL_TABLET | Freq: Every day | ORAL | Status: DC

## 2015-07-23 NOTE — ED Notes (Signed)
cbg 228

## 2015-07-23 NOTE — ED Notes (Signed)
Pt ambulated to restroom. 

## 2015-08-01 ENCOUNTER — Ambulatory Visit: Payer: Self-pay | Attending: Family Medicine | Admitting: Family Medicine

## 2015-08-01 ENCOUNTER — Encounter: Payer: Self-pay | Admitting: Family Medicine

## 2015-08-01 VITALS — BP 131/82 | HR 70 | Temp 98.4°F | Resp 17 | Ht 69.0 in | Wt 241.2 lb

## 2015-08-01 DIAGNOSIS — E1140 Type 2 diabetes mellitus with diabetic neuropathy, unspecified: Secondary | ICD-10-CM

## 2015-08-01 DIAGNOSIS — N390 Urinary tract infection, site not specified: Secondary | ICD-10-CM

## 2015-08-01 DIAGNOSIS — Z1159 Encounter for screening for other viral diseases: Secondary | ICD-10-CM

## 2015-08-01 DIAGNOSIS — Z124 Encounter for screening for malignant neoplasm of cervix: Secondary | ICD-10-CM

## 2015-08-01 DIAGNOSIS — R0602 Shortness of breath: Secondary | ICD-10-CM | POA: Insufficient documentation

## 2015-08-01 DIAGNOSIS — B9620 Unspecified Escherichia coli [E. coli] as the cause of diseases classified elsewhere: Secondary | ICD-10-CM

## 2015-08-01 DIAGNOSIS — E038 Other specified hypothyroidism: Secondary | ICD-10-CM

## 2015-08-01 DIAGNOSIS — E039 Hypothyroidism, unspecified: Secondary | ICD-10-CM

## 2015-08-01 DIAGNOSIS — IMO0002 Reserved for concepts with insufficient information to code with codable children: Secondary | ICD-10-CM

## 2015-08-01 DIAGNOSIS — E1165 Type 2 diabetes mellitus with hyperglycemia: Secondary | ICD-10-CM

## 2015-08-01 DIAGNOSIS — R109 Unspecified abdominal pain: Secondary | ICD-10-CM | POA: Insufficient documentation

## 2015-08-01 LAB — HEMOCCULT GUIAC POC 1CARD (OFFICE): Fecal Occult Blood, POC: NEGATIVE

## 2015-08-01 LAB — HEPATIC FUNCTION PANEL
ALT: 29 U/L (ref 6–29)
AST: 35 U/L (ref 10–35)
Albumin: 4.3 g/dL (ref 3.6–5.1)
Alkaline Phosphatase: 89 U/L (ref 33–130)
BILIRUBIN DIRECT: 0.2 mg/dL (ref <=0.2)
BILIRUBIN INDIRECT: 0.7 mg/dL (ref 0.2–1.2)
TOTAL PROTEIN: 7.6 g/dL (ref 6.1–8.1)
Total Bilirubin: 0.9 mg/dL (ref 0.2–1.2)

## 2015-08-01 LAB — GLUCOSE, POCT (MANUAL RESULT ENTRY): POC GLUCOSE: 171 mg/dL — AB (ref 70–99)

## 2015-08-01 LAB — POCT URINALYSIS DIPSTICK
BILIRUBIN UA: NEGATIVE
GLUCOSE UA: NEGATIVE
KETONES UA: NEGATIVE
Nitrite, UA: NEGATIVE
Protein, UA: NEGATIVE
SPEC GRAV UA: 1.01
UROBILINOGEN UA: 0.2
pH, UA: 7.5

## 2015-08-01 LAB — TSH: TSH: 7.34 m[IU]/L — AB

## 2015-08-01 LAB — LIPASE: Lipase: 24 U/L (ref 7–60)

## 2015-08-01 LAB — POCT GLYCOSYLATED HEMOGLOBIN (HGB A1C): Hemoglobin A1C: 10.2

## 2015-08-01 MED ORDER — ALBUTEROL SULFATE HFA 108 (90 BASE) MCG/ACT IN AERS
2.0000 | INHALATION_SPRAY | Freq: Four times a day (QID) | RESPIRATORY_TRACT | 0 refills | Status: DC | PRN
Start: 2015-08-01 — End: 2018-12-14

## 2015-08-01 MED ORDER — LEVOTHYROXINE SODIUM 75 MCG PO TABS: 75.0000 ug | ORAL_TABLET | Freq: Every day | ORAL | 3 refills | Status: DC

## 2015-08-01 MED ORDER — LOSARTAN POTASSIUM 50 MG PO TABS: 50.0000 mg | ORAL_TABLET | Freq: Every day | ORAL | 3 refills | Status: DC

## 2015-08-01 MED FILL — LOSARTAN POTASSIUM 50 MG TA: 50 | 30 days supply | Qty: 30 | Fill #0

## 2015-08-01 MED FILL — VENTOLIN HFA 90 MCG INHALER: 108 (90 BAS | 5 days supply | Qty: 18 | Fill #0

## 2015-08-01 MED FILL — LEVOTHYROXINE 75 MCG TABLET: 75 | 30 days supply | Qty: 30 | Fill #0

## 2015-08-01 NOTE — Patient Instructions (Addendum)
Tanya Ball was seen today for abdominal pain.  Diagnoses and all orders for this visit:  DM type 2, uncontrolled, with neuropathy (HCC) -     HgB A1c -     Glucose (CBG) -     losartan (COZAAR) 50 MG tablet; Take 1 tablet (50 mg total) by mouth daily.  Subclinical hypothyroidism -     TSH -     levothyroxine (SYNTHROID, LEVOTHROID) 75 MCG tablet; Take 1 tablet (75 mcg total) by mouth daily.  Need for hepatitis C screening test -     Cancel: Hepatitis C antibody, reflex  Pap smear for cervical cancer screening -     Cytology - PAP  Left sided abdominal pain -     Lipase -     Hepatic Function Panel -     POCT urinalysis dipstick -     POCT occult blood stool -     Urine culture  Shortness of breath -     albuterol (PROVENTIL HFA;VENTOLIN HFA) 108 (90 Base) MCG/ACT inhaler; Inhale 2 puffs into the lungs every 6 (six) hours as needed for wheezing or shortness of breath.  Abdominal pain could be due to gastritis or uncontrolled diabetes or untreated hypothyroidism. You CBC and kidney function test were normal on 07/22/15 which is a good sign. Lymphoma or acute  inflammatory process is not likely when the CBC is normal.   Plan today is lab evaluation, get you back on synthroid and medication to control sugars.   You will be called with lab and pap results  F/u in 3 weeks for diabetes and shortness of breath follow up    Dr. Armen Pickup

## 2015-08-01 NOTE — Assessment & Plan Note (Signed)
Normal exam. No resp distress today Normal recent CBC  Albuterol for prn use Will address further at f/u

## 2015-08-01 NOTE — Progress Notes (Signed)
C/C patients complains of stomach pain Pain has been going on for 2 weeks When patient eats she gets pains in abdomen Patient states left side feels swollen

## 2015-08-01 NOTE — Progress Notes (Addendum)
LOGO@  Subjective:  Patient ID: Tanya Ball, female    DOB: 06/12/1960  Age: 55 y.o. MRN: [id number]  CC: Abdominal Pain   HPI Latrina Guttman has uncontrolled, diabetes, hypothyroidism she presents to re-establish care and for   1. Abdominal pain: x 2 months. L mid abdomen. Has a fullness and pressure feeling that is sometimes but not always exacerbated by eating. No associated fever, chills, nausea, emesis, diarrhea or constipation. She started back on metformin two weeks ago and has noted some loose stools associated with this. She also noted some blood in her urine two weeks ago. She had has a c-scope while living in [place] in 2012/2013. She does not recall the name of the clinic or the doctor.  She remembers that one polyp was removed. She denies ETOH.   2. Diabetes: she is taking metformin that was restarted 10 days ago after ED visit where she was found to be hyperglycemic. She has neuropathy in her feet. No foot sores. She is not taking glipizide as she had lightheaded feeling when she took it 2 years ago.   3. Hypothyroidism: she has not been on synthroid for years. She reports intermittent chest pains and feeling chest tightness. No SOB or cough. No swelling. No weight gain.   4. HM: due for pap smear. She declines Hep C screening at this time due to lack of insurance.   Social History  Substance Use Topics  . Smoking status: Former Research scientist (life sciences)  . Smokeless tobacco: Never Used  . Alcohol use No    Past Medical History:  Diagnosis Date  . ADD (attention deficit disorder) 2002  . Arthritis 2006   knees, fingers   . Asthma 2004  . Diabetes mellitus without complication ([place]) 9449  . Hypertension 2009  . Hypothyroidism 2008    previously on levothyroxine   . Sciatica     Past Surgical History:  Procedure Laterality Date  . ENDOMETRIAL ABLATION  2006   . KNEE SURGERY  2012, 2004    both knees, meniscus surgery     Family History  Problem Relation Age of Onset   . Diabetes Mother   . Liver disease Father   . Cancer Brother     Hodgkin's Lymphoma   . Aneurysm Maternal Grandmother 24    Social History  Substance Use Topics  . Smoking status: Former Research scientist (life sciences)  . Smokeless tobacco: Never Used  . Alcohol use No    ROS Review of Systems  Constitutional: Negative for chills and fever.  Eyes: Negative for visual disturbance.  Respiratory: Positive for shortness of breath. Negative for cough.   Cardiovascular: Positive for chest pain. Negative for palpitations and leg swelling.  Gastrointestinal: Positive for abdominal pain. Negative for blood in stool.  Musculoskeletal: Positive for arthralgias and back pain.  Skin: Negative for rash.  Allergic/Immunologic: Negative for immunocompromised state.  Hematological: Negative for adenopathy. Does not bruise/bleed easily.  Psychiatric/Behavioral: Negative for dysphoric mood and suicidal ideas.    Objective:   Today's Vitals: BP 131/82 (BP Location: Right Arm, Patient Position: Sitting, Cuff Size: Large)   Pulse 70   Temp 98.4 F (36.9 C) (Oral)   Resp 17   Ht _0  (1.753 m)   Wt 241 lb 3.2 oz (109.4 kg)   SpO2 95%   BMI 35.62 kg/m  Wt Readings from Last 3 Encounters:  08/01/15 241 lb 3.2 oz (109.4 kg)  09/20/13 251 lb (113.9 kg)  09/05/13 250 lb (113.4 kg)    Physical  Exam  Constitutional: She is oriented to person, place, and time. She appears well-developed and well-nourished. No distress.  Obese   HENT:  Head: Normocephalic and atraumatic.  Cardiovascular: Normal rate, regular rhythm, normal heart sounds and intact distal pulses.   Pulmonary/Chest: Effort normal and breath sounds normal.  Abdominal: Soft. Bowel sounds are normal. She exhibits no distension and no mass. There is tenderness in the left upper quadrant. There is no rebound, no guarding and no CVA tenderness.  Genitourinary: Uterus normal. Rectal exam shows guaiac negative stool. Pelvic exam was performed with patient  prone. There is no rash, tenderness or lesion on the right labia. There is no rash, tenderness or lesion on the left labia. Cervix exhibits no motion tenderness, no discharge and no friability. Vaginal discharge found.  Musculoskeletal: She exhibits no edema.  Lymphadenopathy:       Right: No inguinal adenopathy present.       Left: No inguinal adenopathy present.  Neurological: She is alert and oriented to person, place, and time.  Skin: Skin is warm and dry. No rash noted.  Psychiatric: She has a normal mood and affect.   Lab Results  Component Value Date   HGBA1C 9.6 09/05/2013   Lab Results  Component Value Date   HGBA1C 10.2 08/01/2015    CBG 171 UA: trace Hgb, large LE   Depression screen [facility] 2/9 08/01/2015 09/05/2013  Decreased Interest 0 3  Down, Depressed, Hopeless 0 3  PHQ - 2 Score 0 6  Altered sleeping 3 3  Tired, decreased energy 3 3  Change in appetite 0 3  Feeling bad or failure about yourself  3 3  Trouble concentrating 3 3  Moving slowly or fidgety/restless 0 -  Suicidal thoughts 0 3  PHQ-9 Score 12 24   GAD 7 : Generalized Anxiety Score 08/01/2015  Nervous, Anxious, on Edge 3  Control/stop worrying 0  Worry too much - different things 0  Trouble relaxing 0  Restless 1  Easily annoyed or irritable 1  Afraid - awful might happen 0  Total GAD 7 Score 5    Assessment & Plan:   Problem List Items Addressed This Visit      High   Subclinical hypothyroidism (Chronic)   Relevant Medications   levothyroxine (SYNTHROID, LEVOTHROID) 75 MCG tablet   Other Relevant Orders   TSH   DM type 2, uncontrolled, with neuropathy (HCC) - Primary (Chronic)   Relevant Medications   losartan (COZAAR) 50 MG tablet   Other Relevant Orders   HgB A1c (Completed)   Glucose (CBG) (Completed)    Other Visit Diagnoses    Need for hepatitis C screening test       Pap smear for cervical cancer screening       Relevant Orders   Cytology - PAP   Left sided abdominal pain        Relevant Orders   Lipase   Hepatic Function Panel   POCT urinalysis dipstick (Completed)   POCT occult blood stool (Completed)   Urine culture   Shortness of breath       Relevant Medications   albuterol (PROVENTIL HFA;VENTOLIN HFA) 108 (90 Base) MCG/ACT inhaler      Outpatient Encounter Prescriptions as of 08/01/2015  Medication Sig  . ACCU-CHEK FASTCLIX LANCETS MISC 1 each by Does not apply route 3 (three) times daily.  . Blood Glucose Monitoring Suppl (ACCU-CHEK NANO SMARTVIEW) W/DEVICE KIT 1 Device by Does not apply route as needed.  Marland Kitchen  glucose blood (ACCU-CHEK SMARTVIEW) test strip 1 each by Other route 5 (five) times daily.  . metFORMIN (GLUCOPHAGE) 850 MG tablet Take 1 tablet (850 mg total) by mouth daily with breakfast.  . atorvastatin (LIPITOR) 10 MG tablet Take 1 tablet (10 mg total) by mouth daily. (Patient not taking: Reported on 07/22/2015)  . glipiZIDE (GLUCOTROL) 10 MG tablet Take 0.5 tablets (5 mg total) by mouth 2 (two) times daily before a meal. (Patient not taking: Reported on 07/22/2015)  . levothyroxine (SYNTHROID, LEVOTHROID) 75 MCG tablet Take 1 tablet (75 mcg total) by mouth daily. (Patient not taking: Reported on 07/22/2015)  . lisinopril (PRINIVIL,ZESTRIL) 10 MG tablet Take 1 tablet (10 mg total) by mouth daily. (Patient not taking: Reported on 07/22/2015)  . naproxen sodium (ANAPROX) 220 MG tablet Take 220 mg by mouth 2 (two) times daily with a meal.   No facility-administered encounter medications on file as of 08/01/2015.     Follow-up: Return in about 3 weeks (around 08/22/2015) for diabetes and SOB .    Boykin Nearing MD

## 2015-08-01 NOTE — Assessment & Plan Note (Signed)
Untreated hypothyroidism Refilled synthroid TSH today Will adjust to normal TSH level

## 2015-08-01 NOTE — Assessment & Plan Note (Signed)
Uncontrolled diabetes in obese patient Plan to slowly titrate up on metformin as tolerated Continue low carb diet

## 2015-08-01 NOTE — Assessment & Plan Note (Signed)
Suspect related to hyperglycemia and hypothyroidism  Checking LFTS, lipase  Will treat DM2 and hypothyroidism

## 2015-08-04 ENCOUNTER — Telehealth: Payer: Self-pay

## 2015-08-04 ENCOUNTER — Telehealth: Payer: Self-pay | Admitting: Family Medicine

## 2015-08-04 LAB — CYTOLOGY - PAP

## 2015-08-04 LAB — URINE CULTURE: Colony Count: >=100000

## 2015-08-04 MED ORDER — CEPHALEXIN 500 MG PO CAPS: 500.0000 mg | ORAL_CAPSULE | Freq: Four times a day (QID) | ORAL | 0 refills | Status: AC

## 2015-08-04 MED FILL — CEPHALEXIN 500 MG CAPSULE: 500 | 7 days supply | Qty: 28 | Fill #0

## 2015-08-04 NOTE — Addendum Note (Signed)
Addended by: Dessa Phi on: 08/04/2015 02:06 PM   Modules accepted: Orders

## 2015-08-04 NOTE — Telephone Encounter (Signed)
Patient was called and informed of results. 

## 2015-08-05 ENCOUNTER — Telehealth: Payer: Self-pay

## 2015-08-05 NOTE — Telephone Encounter (Signed)
Patient was called with results for her pap and her TSH. Patient was told to restart medication per Dr. Armen Pickup

## 2015-08-06 LAB — CERVICOVAGINAL ANCILLARY ONLY: Candida vaginitis: NEGATIVE

## 2015-08-18 ENCOUNTER — Ambulatory Visit: Payer: Self-pay | Attending: Family Medicine | Admitting: Family Medicine

## 2015-08-18 ENCOUNTER — Encounter: Payer: Self-pay | Admitting: Family Medicine

## 2015-08-18 ENCOUNTER — Other Ambulatory Visit: Payer: Self-pay

## 2015-08-18 VITALS — BP 150/82 | HR 82 | Temp 98.6°F | Ht 69.0 in | Wt 240.2 lb

## 2015-08-18 DIAGNOSIS — Z7901 Long term (current) use of anticoagulants: Secondary | ICD-10-CM | POA: Insufficient documentation

## 2015-08-18 DIAGNOSIS — R109 Unspecified abdominal pain: Secondary | ICD-10-CM | POA: Insufficient documentation

## 2015-08-18 DIAGNOSIS — R0602 Shortness of breath: Secondary | ICD-10-CM | POA: Insufficient documentation

## 2015-08-18 DIAGNOSIS — E039 Hypothyroidism, unspecified: Secondary | ICD-10-CM | POA: Insufficient documentation

## 2015-08-18 DIAGNOSIS — I10 Essential (primary) hypertension: Secondary | ICD-10-CM | POA: Insufficient documentation

## 2015-08-18 DIAGNOSIS — E1165 Type 2 diabetes mellitus with hyperglycemia: Secondary | ICD-10-CM

## 2015-08-18 DIAGNOSIS — E1140 Type 2 diabetes mellitus with diabetic neuropathy, unspecified: Secondary | ICD-10-CM

## 2015-08-18 DIAGNOSIS — IMO0002 Reserved for concepts with insufficient information to code with codable children: Secondary | ICD-10-CM

## 2015-08-18 DIAGNOSIS — E119 Type 2 diabetes mellitus without complications: Secondary | ICD-10-CM | POA: Insufficient documentation

## 2015-08-18 LAB — GLUCOSE, POCT (MANUAL RESULT ENTRY): POC Glucose: 159 mg/dl — AB (ref 70–99)

## 2015-08-18 MED ORDER — METFORMIN HCL ER 500 MG PO TB24: 1000.0000 mg | ORAL_TABLET | Freq: Every day | ORAL | 3 refills | Status: DC

## 2015-08-18 MED ORDER — LOSARTAN POTASSIUM 100 MG PO TABS: 100.0000 mg | ORAL_TABLET | Freq: Every day | ORAL | 3 refills | Status: DC

## 2015-08-18 NOTE — Assessment & Plan Note (Signed)
Elevated BP Increase losartan to 100 mg daily

## 2015-08-18 NOTE — Progress Notes (Signed)
Subjective:  Patient ID: Tanya Ball, female    DOB: 01/04/1961  Age: 55 y.o. MRN: [id number]  CC: Follow-up (diabetes)   HPI Jaunita Mikels has HTN,  diabetes and hypothyroidism she  presents for   1.  SOB: give hx of asthma. Has not used prescribed albuterol stating that SOB has improved and she is usually only symptomatic when the weather is hot and humid. Denies cough and CP.  2. L side pain:  X 10 weeks. Improved following treatment of E. Coli UTI found at last OV and since restarting synthroid. Has constipation. Taking metformin 850 mg IR. No emesis, nausea,  fever or chills. No weight loss.   Social History  Substance Use Topics  . Smoking status: Former Research scientist (life sciences)  . Smokeless tobacco: Never Used  . Alcohol use No    Outpatient Medications Prior to Visit  Medication Sig Dispense Refill  . ACCU-CHEK FASTCLIX LANCETS MISC 1 each by Does not apply route 3 (three) times daily. 102 each 11  . albuterol (PROVENTIL HFA;VENTOLIN HFA) 108 (90 Base) MCG/ACT inhaler Inhale 2 puffs into the lungs every 6 (six) hours as needed for wheezing or shortness of breath. 1 Inhaler 0  . Blood Glucose Monitoring Suppl (ACCU-CHEK NANO SMARTVIEW) W/DEVICE KIT 1 Device by Does not apply route as needed. 1 kit 0  . glucose blood (ACCU-CHEK SMARTVIEW) test strip 1 each by Other route 5 (five) times daily. 100 each 11  . levothyroxine (SYNTHROID, LEVOTHROID) 75 MCG tablet Take 1 tablet (75 mcg total) by mouth daily. 30 tablet 3  . losartan (COZAAR) 50 MG tablet Take 1 tablet (50 mg total) by mouth daily. 90 tablet 3  . metFORMIN (GLUCOPHAGE) 850 MG tablet Take 1 tablet (850 mg total) by mouth daily with breakfast. 30 tablet 3  . naproxen sodium (ANAPROX) 220 MG tablet Take 220 mg by mouth 2 (two) times daily with a meal.    . atorvastatin (LIPITOR) 10 MG tablet Take 1 tablet (10 mg total) by mouth daily. (Patient not taking: Reported on 07/22/2015) 90 tablet 3   No facility-administered  medications prior to visit.     ROS Review of Systems  Constitutional: Negative for chills and fever.  Eyes: Negative for visual disturbance.  Respiratory: Negative for cough and shortness of breath.   Cardiovascular: Negative for chest pain, palpitations and leg swelling.  Gastrointestinal: Positive for abdominal pain and constipation. Negative for blood in stool.  Musculoskeletal: Positive for arthralgias and back pain.  Skin: Negative for rash.  Allergic/Immunologic: Negative for immunocompromised state.  Hematological: Negative for adenopathy. Does not bruise/bleed easily.  Psychiatric/Behavioral: Negative for dysphoric mood and suicidal ideas.    Objective:  BP (!) 150/82   Pulse 82   Temp 98.6 F (37 C) (Oral)   Ht _0  (1.753 m)   Wt 240 lb 3.2 oz (109 kg)   SpO2 97%   BMI 35.47 kg/m   BP/Weight 08/18/2015 08/01/2015 0/63/0160  Systolic BP 109 323 557  Diastolic BP 82 82 75  Wt. (Lbs) 240.2 241.2 -  BMI 35.47 35.62 -    Physical Exam  Constitutional: She is oriented to person, place, and time. She appears well-developed and well-nourished. No distress.  HENT:  Head: Normocephalic and atraumatic.  Cardiovascular: Normal rate, regular rhythm, normal heart sounds and intact distal pulses.   Pulmonary/Chest: Effort normal and breath sounds normal.  Musculoskeletal: She exhibits no edema.  Neurological: She is alert and oriented to person, place, and time.  Skin:  Skin is warm and dry. No rash noted.  Psychiatric: She has a normal mood and affect.   EKG: normal EKG, normal sinus rhythm.  Lab Results  Component Value Date   HGBA1C 10.2 08/01/2015   CBG 159 Assessment & Plan:   Ellese was seen today for follow-up.  Diagnoses and all orders for this visit:  DM type 2, uncontrolled, with neuropathy (HCC) -     Glucose (CBG) -     metFORMIN (GLUCOPHAGE XR) 500 MG 24 hr tablet; Take 2 tablets (1,000 mg total) by mouth daily with breakfast.  Shortness of  breath -     EKG 12-Lead    No orders of the defined types were placed in this encounter.   Follow-up: No Follow-up on file.   Boykin Nearing MD

## 2015-08-18 NOTE — Assessment & Plan Note (Signed)
Improved with  Cooler weather Prn albuterol PFTs if patient becomes more symptomatic and controller medicine

## 2015-08-18 NOTE — Patient Instructions (Addendum)
Tanya CornfieldStephanie was seen today for follow-up.  Diagnoses and all orders for this visit:  DM type 2, uncontrolled, with neuropathy (HCC) -     Glucose (CBG) -     metFORMIN (GLUCOPHAGE XR) 500 MG 24 hr tablet; Take 2 tablets (1,000 mg total) by mouth daily with breakfast.  Shortness of breath -     EKG 12-Lead   Your EKG was normal today.  Use albuterol as needed for shortness of breath and keep track of how often you have symptoms and the time of day   Change to metformin 500 mg XR, 500 mg for first week, then 1000 mg once daily  F/u in 2 months diabetes and flu shot   Dr. Armen PickupFunches

## 2015-08-18 NOTE — Progress Notes (Signed)
C/C: pain on left side  Peak Flow Results: 1-220 2-310 3-300

## 2015-08-18 NOTE — Assessment & Plan Note (Signed)
Improving following treatment of E. Coli UTI Change metformin to XR

## 2015-09-02 MED FILL — METFORMIN HCL ER 500 MG TAB: 500 | 30 days supply | Qty: 60 | Fill #0

## 2015-09-02 MED FILL — LEVOTHYROXINE 75 MCG TABLET: 75 | 30 days supply | Qty: 30 | Fill #1

## 2015-09-02 MED FILL — LOSARTAN POTASSIUM 50 MG TA: 50 | 30 days supply | Qty: 30 | Fill #1

## 2015-09-19 LAB — GLUCOSE, POCT (MANUAL RESULT ENTRY): POC Glucose: 272 mg/dl — AB (ref 70–99)

## 2015-10-16 LAB — GLUCOSE, POCT (MANUAL RESULT ENTRY): POC GLUCOSE: 194 mg/dL — AB (ref 70–99)

## 2015-10-20 ENCOUNTER — Telehealth: Payer: Self-pay | Admitting: Family Medicine

## 2015-10-20 DIAGNOSIS — E1165 Type 2 diabetes mellitus with hyperglycemia: Principal | ICD-10-CM

## 2015-10-20 DIAGNOSIS — E1140 Type 2 diabetes mellitus with diabetic neuropathy, unspecified: Secondary | ICD-10-CM

## 2015-10-20 DIAGNOSIS — IMO0002 Reserved for concepts with insufficient information to code with codable children: Secondary | ICD-10-CM

## 2015-10-20 DIAGNOSIS — I10 Essential (primary) hypertension: Secondary | ICD-10-CM

## 2015-10-20 MED FILL — METFORMIN HCL ER 500 MG TAB: 500 | 30 days supply | Qty: 60 | Fill #1

## 2015-10-20 MED FILL — LEVOTHYROXINE 75 MCG TABLET: 75 | 30 days supply | Qty: 30 | Fill #2

## 2015-10-20 NOTE — Telephone Encounter (Signed)
Pt came into office requesting change of medication, pt is taking losartan (COZAAR) 100 MG tablet, pt is only  taking 1/2 pill and feels very lightheaded when she takes whole tablet.pt would like to be changed back to Lisinopril if possible. Please f/up

## 2015-10-22 MED ORDER — LISINOPRIL 10 MG PO TABS: 10.0000 mg | ORAL_TABLET | Freq: Every day | ORAL | 5 refills | Status: DC

## 2015-10-22 MED FILL — LISINOPRIL 10 MG TABLET: 10 | 30 days supply | Qty: 30 | Fill #0

## 2015-10-22 NOTE — Telephone Encounter (Signed)
Lisinopril 10 mg re-ordered Losartan discontinued  Patient must be sure to take her medication every day, goal BP is < 140/90 at all times She should increase intake of water as well, this will help with lightheadedness and dizziness. She should aim for 2 Liters or water a day.

## 2015-10-22 NOTE — Telephone Encounter (Signed)
Contacted pt to make aware of the changes to medication also the discontinued losartan. I also informed pt of the goal bp and the water intake as well. Pt didn't answer lvm regarding information and if she has any questions or concerns to give me a call

## 2015-11-13 NOTE — Congregational Nurse Program (Signed)
Congregational Nurse Program Note  Date of Encounter: 10/16/2015  Past Medical History: Past Medical History:  Diagnosis Date  . ADD (attention deficit disorder) 2002  . Arthritis 2006   knees, fingers   . Asthma 2004  . Diabetes mellitus without complication (HCC) 2008  . Hypertension 2009  . Hypothyroidism 2008    previously on levothyroxine   . Sciatica     Encounter Details:     CNP Questionnaire - 10/16/15 1745      Patient Demographics   Is this a new or existing patient? New   Patient is considered a/an Not Applicable   Race Caucasian/White     Patient Assistance   [facility]   Patient's financial/insurance status Private Insurance Coverage   Uninsured Patient ([place] [facility]/[employer] Connects) No   Patient referred to apply for the following financial assistance Not Applicable   Food insecurities addressed Not Applicable   Transportation assistance No   Assistance securing medications No   Educational health offerings Diabetes;Chronic disease;Hypertension     Encounter Details   Primary purpose of visit Education/Health Concerns;Navigating the Healthcare System;Chronic Illness/Condition Visit   Was an Emergency Department visit averted? Not Applicable   Does patient have a medical provider? Yes   Patient referred to Not Applicable   Was a mental health screening completed? (GAINS tool) No   Does patient have dental issues? Yes   Was a dental referral made? No resources for a referral   Does patient have vision issues? Yes   Was a vision referral made? No   Does your patient have an abnormal blood pressure today? Yes   Since previous encounter, have you referred patient for abnormal blood pressure that resulted in a new diagnosis or medication change? No   Does your patient have an abnormal blood glucose today? Yes   Since previous encounter, have you referred patient for abnormal blood glucose that resulted in a new  diagnosis or medication change? No   Was there a life-saving intervention made? No      Client screened for BP and BS and shared she has diabetes and has not been taking care of herself. Blood sugar was 194 non fasting. BP 145/87.  She is on Losartin, which makes her dizzy.  She has had depression and up until a couple of months ago, she was not taking bs or taking her meds.  She is now trying to keep up with her bs.  She is doing pretty well with her diet.  She needs some guidance with carb counting.  I will followup next week.

## 2015-11-14 NOTE — Congregational Nurse Program (Signed)
Congregational Nurse Program Note  Date of Encounter: 10/30/2015  Past Medical History: Past Medical History:  Diagnosis Date  . ADD (attention deficit disorder) 2002  . Arthritis 2006   knees, fingers   . Asthma 2004  . Diabetes mellitus without complication (HCC) 2008  . Hypertension 2009  . Hypothyroidism 2008    previously on levothyroxine   . Sciatica     Encounter Details:     CNP Questionnaire - 10/30/15 0228      Patient Demographics   Is this a new or existing patient? Existing   Patient is considered a/an Not Applicable   Race Caucasian/White     Patient Assistance   [facility]   Patient's financial/insurance status Private Insurance Coverage   Uninsured Patient ([place] [facility]/Care Connects) No   Patient referred to apply for the following financial assistance Not Applicable   Food insecurities addressed Not Applicable   Transportation assistance No   Assistance securing medications No   Educational health offerings Diabetes;Chronic disease;Hypertension     Encounter Details   Primary purpose of visit Education/Health Concerns;Navigating the Healthcare System;Chronic Illness/Condition Visit   Was an Emergency Department visit averted? No   Does patient have a medical provider? Yes   Patient referred to Doctor referral for a non-emergent behavioral health crisis   Was a mental health screening completed? (GAINS tool) No   Does patient have dental issues? Yes   Was a dental referral made? No resources for a referral   Does patient have vision issues? Yes   Was a vision referral made? No   Does your patient have an abnormal blood pressure today? No   Since previous encounter, have you referred patient for abnormal blood pressure that resulted in a new diagnosis or medication change? No   Does your patient have an abnormal blood glucose today? No   Since previous encounter, have you referred patient for abnormal blood  glucose that resulted in a new diagnosis or medication change? No   Was there a life-saving intervention made? No      Client reports she has been trying to maintain good eating habits.  She has found a carb counter and food diary on line and can access.  I will search for a carb counter book.

## 2015-12-02 MED FILL — LEVOTHYROXINE 75 MCG TABLET: 75 | 30 days supply | Qty: 30 | Fill #3

## 2015-12-02 MED FILL — ?LISINOPRIL 10 MG TABLET: 10 | 30 days supply | Qty: 30 | Fill #1

## 2015-12-02 MED FILL — METFORMIN HCL ER 500 MG TAB: 500 | 30 days supply | Qty: 60 | Fill #2

## 2015-12-13 NOTE — Congregational Nurse Program (Signed)
Congregational Nurse Program Note  Date of Encounter: 11/13/2015  Past Medical History: Past Medical History:  Diagnosis Date  . ADD (attention deficit disorder) 2002  . Arthritis 2006   knees, fingers   . Asthma 2004  . Diabetes mellitus without complication (HCC) 2008  . Hypertension 2009  . Hypothyroidism 2008    previously on levothyroxine   . Sciatica     Encounter Details:   Talked with client about her diabetes.  Will find carb counting  book.  Trying to eat better.

## 2016-01-05 NOTE — Congregational Nurse Program (Signed)
Congregational Nurse Program Note  Date of Encounter: 12/11/2015  Past Medical History: Past Medical History:  Diagnosis Date  . ADD (attention deficit disorder) 2002  . Arthritis 2006   knees, fingers   . Asthma 2004  . Diabetes mellitus without complication (HCC) 2008  . Hypertension 2009  . Hypothyroidism 2008    previously on levothyroxine   . Sciatica     Encounter Details:     CNP Questionnaire - 12/11/15 0101      Patient Demographics   Is this a new or existing patient? Existing   Patient is considered a/an Not Applicable   Race Caucasian/White     Patient Assistance   [facility]   Patient's financial/insurance status Private Insurance Coverage   Uninsured Patient ([place] [facility]/Care Connects) No   Patient referred to apply for the following financial assistance Not Applicable   Food insecurities addressed Not Applicable   Transportation assistance No   Assistance securing medications No   Educational health offerings Diabetes;Chronic disease;Hypertension     Encounter Details   Primary purpose of visit Education/Health Concerns;Navigating the Healthcare System;Chronic Illness/Condition Visit   Was an Emergency Department visit averted? No   Does patient have a medical provider? Yes   Patient referred to Follow up with established PCP   Was a mental health screening completed? (GAINS tool) No   Does patient have dental issues? Yes   Was a dental referral made? No resources for a referral   Does patient have vision issues? Yes   Was a vision referral made? No   Does your patient have an abnormal blood pressure today? No   Since previous encounter, have you referred patient for abnormal blood pressure that resulted in a new diagnosis or medication change? No   Does your patient have an abnormal blood glucose today? No   Since previous encounter, have you referred patient for abnormal blood glucose that resulted in a new  diagnosis or medication change? No   Was there a life-saving intervention made? No       Talked with client about her diabetes.  Took her the carb counting book. She has been trying to eating better

## 2016-01-15 ENCOUNTER — Other Ambulatory Visit: Payer: Self-pay | Admitting: Family Medicine

## 2016-01-15 DIAGNOSIS — E038 Other specified hypothyroidism: Secondary | ICD-10-CM

## 2016-01-15 DIAGNOSIS — E039 Hypothyroidism, unspecified: Secondary | ICD-10-CM

## 2016-01-15 MED FILL — ?LISINOPRIL 10 MG TABLET: 10 | 30 days supply | Qty: 30 | Fill #2

## 2016-01-15 MED FILL — LEVOTHYROXINE 75 MCG TABLET: 75 | 30 days supply | Qty: 30 | Fill #0

## 2016-01-15 MED FILL — METFORMIN HCL ER 500 MG TAB: 500 | 30 days supply | Qty: 60 | Fill #3

## 2016-03-09 ENCOUNTER — Other Ambulatory Visit: Payer: Self-pay | Admitting: Family Medicine

## 2016-03-09 DIAGNOSIS — E038 Other specified hypothyroidism: Secondary | ICD-10-CM

## 2016-03-09 DIAGNOSIS — E1140 Type 2 diabetes mellitus with diabetic neuropathy, unspecified: Secondary | ICD-10-CM

## 2016-03-09 DIAGNOSIS — E039 Hypothyroidism, unspecified: Secondary | ICD-10-CM

## 2016-03-09 DIAGNOSIS — E1165 Type 2 diabetes mellitus with hyperglycemia: Secondary | ICD-10-CM

## 2016-03-09 DIAGNOSIS — IMO0002 Reserved for concepts with insufficient information to code with codable children: Secondary | ICD-10-CM

## 2016-03-09 MED FILL — ?LISINOPRIL 10 MG TABLET: 10 | 30 days supply | Qty: 30 | Fill #3

## 2016-03-09 MED FILL — METFORMIN HCL ER 500 MG TAB: 500 | 30 days supply | Qty: 60 | Fill #0

## 2016-03-09 MED FILL — LEVOTHYROXINE 75 MCG TABLET: 75 | 30 days supply | Qty: 30 | Fill #0

## 2016-04-27 ENCOUNTER — Other Ambulatory Visit: Payer: Self-pay | Admitting: Family Medicine

## 2016-04-27 DIAGNOSIS — E1140 Type 2 diabetes mellitus with diabetic neuropathy, unspecified: Secondary | ICD-10-CM

## 2016-04-27 DIAGNOSIS — E1165 Type 2 diabetes mellitus with hyperglycemia: Principal | ICD-10-CM

## 2016-04-27 DIAGNOSIS — IMO0002 Reserved for concepts with insufficient information to code with codable children: Secondary | ICD-10-CM

## 2016-04-27 MED FILL — ?LISINOPRIL 10 MG TABLET: 10 | 30 days supply | Qty: 30 | Fill #4

## 2016-05-07 MED FILL — METFORMIN HCL ER 500 MG TAB: 500 | 30 days supply | Qty: 60 | Fill #0

## 2016-05-17 ENCOUNTER — Encounter (HOSPITAL_COMMUNITY): Payer: Self-pay | Admitting: Emergency Medicine

## 2016-05-17 ENCOUNTER — Emergency Department (HOSPITAL_COMMUNITY): Payer: Self-pay

## 2016-05-17 DIAGNOSIS — R0789 Other chest pain: Secondary | ICD-10-CM | POA: Insufficient documentation

## 2016-05-17 DIAGNOSIS — Z87891 Personal history of nicotine dependence: Secondary | ICD-10-CM | POA: Insufficient documentation

## 2016-05-17 DIAGNOSIS — R42 Dizziness and giddiness: Secondary | ICD-10-CM | POA: Insufficient documentation

## 2016-05-17 DIAGNOSIS — E039 Hypothyroidism, unspecified: Secondary | ICD-10-CM | POA: Insufficient documentation

## 2016-05-17 DIAGNOSIS — Z79899 Other long term (current) drug therapy: Secondary | ICD-10-CM | POA: Insufficient documentation

## 2016-05-17 DIAGNOSIS — F909 Attention-deficit hyperactivity disorder, unspecified type: Secondary | ICD-10-CM | POA: Insufficient documentation

## 2016-05-17 DIAGNOSIS — Z7984 Long term (current) use of oral hypoglycemic drugs: Secondary | ICD-10-CM | POA: Insufficient documentation

## 2016-05-17 DIAGNOSIS — I10 Essential (primary) hypertension: Secondary | ICD-10-CM | POA: Insufficient documentation

## 2016-05-17 DIAGNOSIS — E1140 Type 2 diabetes mellitus with diabetic neuropathy, unspecified: Secondary | ICD-10-CM | POA: Insufficient documentation

## 2016-05-17 LAB — CBC
HEMATOCRIT: 40.3 % (ref 36.0–46.0)
Hemoglobin: 13.4 g/dL (ref 12.0–15.0)
MCH: 28.2 pg (ref 26.0–34.0)
MCHC: 33.3 g/dL (ref 30.0–36.0)
MCV: 84.7 fL (ref 78.0–100.0)
Platelets: 153 10*3/uL (ref 150–400)
RBC: 4.76 MIL/uL (ref 3.87–5.11)
RDW: 12.6 % (ref 11.5–15.5)
WBC: 9.2 10*3/uL (ref 4.0–10.5)

## 2016-05-17 LAB — BASIC METABOLIC PANEL
Anion gap: 9 (ref 5–15)
BUN: 13 mg/dL (ref 6–20)
CO2: 25 mmol/L (ref 22–32)
Calcium: 9.5 mg/dL (ref 8.9–10.3)
Chloride: 102 mmol/L (ref 101–111)
Creatinine, Ser: 1.09 mg/dL — ABNORMAL HIGH (ref 0.44–1.00)
GFR calc Af Amer: >60 mL/min (ref >60)
GFR, EST NON AFRICAN AMERICAN: 56 mL/min — AB (ref >60)
GLUCOSE: 244 mg/dL — AB (ref 65–99)
POTASSIUM: 4 mmol/L (ref 3.5–5.1)
Sodium: 136 mmol/L (ref 135–145)

## 2016-05-17 LAB — I-STAT TROPONIN, ED: TROPONIN I, POC: 0 ng/mL (ref 0.00–0.08)

## 2016-05-17 NOTE — ED Triage Notes (Signed)
Pt presents to ED for assesment of centralized chest pressure,SOB and HTN.  Pt states she experienced some dizziness at work, checked her blood pressure at a friend;s house and came here.  192/97 at triage.

## 2016-05-18 ENCOUNTER — Emergency Department (HOSPITAL_COMMUNITY)
Admission: EM | Admit: 2016-05-18 | Discharge: 2016-05-18 | Disposition: A | Discharge status: Home / Self Care | Payer: Self-pay | Attending: Emergency Medicine | Admitting: Emergency Medicine

## 2016-05-18 DIAGNOSIS — I10 Essential (primary) hypertension: Secondary | ICD-10-CM

## 2016-05-18 DIAGNOSIS — R42 Dizziness and giddiness: Secondary | ICD-10-CM

## 2016-05-18 DIAGNOSIS — R0789 Other chest pain: Secondary | ICD-10-CM

## 2016-05-18 LAB — HEPATIC FUNCTION PANEL
ALBUMIN: 3.9 g/dL (ref 3.5–5.0)
ALK PHOS: 99 U/L (ref 38–126)
ALT: 27 U/L (ref 14–54)
AST: 33 U/L (ref 15–41)
BILIRUBIN TOTAL: 0.8 mg/dL (ref 0.3–1.2)
Bilirubin, Direct: 0.2 mg/dL (ref 0.1–0.5)
Indirect Bilirubin: 0.6 mg/dL (ref 0.3–0.9)
Total Protein: 7.2 g/dL (ref 6.5–8.1)

## 2016-05-18 LAB — I-STAT TROPONIN, ED: Troponin i, poc: 0.01 ng/mL (ref 0.00–0.08)

## 2016-05-18 LAB — LIPASE, BLOOD: Lipase: 27 U/L (ref 11–51)

## 2016-05-18 MED ORDER — NITROGLYCERIN 2 % TD OINT
1.0000 [in_us] | TOPICAL_OINTMENT | Freq: Once | TRANSDERMAL | Status: AC
Start: 2016-05-18 — End: 2016-05-18
  Administered 2016-05-18: 1 [in_us] via TOPICAL
  Filled 2016-05-18: qty 1

## 2016-05-18 MED ORDER — HYDRALAZINE HCL 20 MG/ML IJ SOLN
5.0000 mg | Freq: Once | INTRAMUSCULAR | Status: AC
  Administered 2016-05-18: 5 mg via INTRAVENOUS
  Filled 2016-05-18: qty 1

## 2016-05-18 MED ORDER — ASPIRIN 81 MG PO CHEW
324.0000 mg | CHEWABLE_TABLET | Freq: Once | ORAL | Status: AC
  Administered 2016-05-18: 324 mg via ORAL
  Filled 2016-05-18: qty 4

## 2016-05-18 NOTE — Discharge Instructions (Signed)
You were seen today for high blood pressure, chest pain, and lightheadedness. Your lab workup is reassuring. Your EKG is also reassuring. Your symptoms improved with blood pressure control. It is very important that he follow up with cardiology and your primary physician for recheck of your blood pressure, medication adjustment, and possible stress testing. If you have recurrence or worsening of your pain, you should be reevaluated.

## 2016-05-18 NOTE — ED Provider Notes (Signed)
[facility] DEPT Provider Note   CSN: [id number] Arrival date & time: 05/17/16  2125  By signing my name below, I, Tanya Ball, attest that this documentation has been prepared under the direction and in the presence of [name], Barbette Hair, MD. Electronically Signed: Oleh Ball, Scribe. 05/18/16. 1:43 AM.   History   Chief Complaint Chief Complaint  Patient presents with  . Chest Pain  . Hypertension  . Dizziness    HPI Tanya Ball is a 56 y.o. female with history of asthma, HTN, hypothyroidism, and diabetes who presents to the ED for evaluation of multiple complaints. This patient states that several hours ago she became lightheaded with racing heartbeat while at work. She took her blood pressure at this time and measured to 382 systolic. She then presented to this facility for workup. Does have history of hypertension; she is taking Lisinopril. At interview, she is also reporting mild dyspnea, mild headache, and 7/10 sternal "tightness" that is non-radiating. Reports that she occasionally has chest pain. It is lower chest pain and sometimes associated with food. She describes it as "bloating." No vertiginous complaints. No peripheral edema.  The history is provided by the patient. No language interpreter was used.  Chest Pain   This is a new problem. The current episode started 6 to 12 hours ago. The problem has been gradually improving. Pain location: central. The pain is at a severity of 7/10. Quality: "tight" Associated symptoms include headaches and shortness of breath. Pertinent negatives include no abdominal pain, no dizziness, no fever, no nausea and no vomiting.    Past Medical History:  Diagnosis Date  . ADD (attention deficit disorder) 2002  . Arthritis 2006   knees, fingers   . Asthma 2004  . Diabetes mellitus without complication ([place]) 5053  . Hypertension 2009  . Hypothyroidism 2008    previously on levothyroxine   . Sciatica     Patient  Active Problem List   Diagnosis Date Noted  . HTN (hypertension) 08/18/2015  . Left sided abdominal pain 08/01/2015  . Shortness of breath 08/01/2015  . Obesity (BMI 30-39.9) 09/05/2013  . ADD (attention deficit disorder) 09/05/2013  . Depression 09/05/2013  . Subclinical hypothyroidism   . DM type 2, uncontrolled, with neuropathy [facility])     Past Surgical History:  Procedure Laterality Date  . ENDOMETRIAL ABLATION  2006   . KNEE SURGERY  2012, 2004    both knees, meniscus surgery     OB History    No data available       Home Medications    Prior to Admission medications   Medication Sig Start Date End Date Taking? Authorizing Provider  ACCU-CHEK FASTCLIX LANCETS MISC 1 each by Does not apply route 3 (three) times daily. 09/20/13   Funches, Adriana Mccallum, MD  albuterol (PROVENTIL HFA;VENTOLIN HFA) 108 (90 Base) MCG/ACT inhaler Inhale 2 puffs into the lungs every 6 (six) hours as needed for wheezing or shortness of breath. 08/01/15   Funches, Adriana Mccallum, MD  atorvastatin (LIPITOR) 10 MG tablet Take 1 tablet (10 mg total) by mouth daily. Patient not taking: Reported on 07/22/2015 09/06/13   Boykin Nearing, MD  Blood Glucose Monitoring Suppl (ACCU-CHEK NANO SMARTVIEW) W/DEVICE KIT 1 Device by Does not apply route as needed. 09/20/13   Funches, Adriana Mccallum, MD  glucose blood (ACCU-CHEK SMARTVIEW) test strip 1 each by Other route 5 (five) times daily. 09/20/13   Funches, Adriana Mccallum, MD  levothyroxine (SYNTHROID, LEVOTHROID) 75 MCG tablet TAKE 1 TABLET BY MOUTH DAILY. 03/09/16  Funches, Josalyn, MD  lisinopril (PRINIVIL,ZESTRIL) 10 MG tablet Take 1 tablet (10 mg total) by mouth daily. 10/22/15   Funches, Adriana Mccallum, MD  metFORMIN (GLUCOPHAGE-XR) 500 MG 24 hr tablet TAKE 2 TABLETS BY MOUTH DAILY WITH BREAKFAST. 05/07/16   Funches, Adriana Mccallum, MD  naproxen sodium (ANAPROX) 220 MG tablet Take 220 mg by mouth 2 (two) times daily with a meal.    [provider]    Family History Family History  Problem  Relation Age of Onset  . Diabetes Mother   . Liver disease Father   . Cancer Brother        Hodgkin's Lymphoma   . Aneurysm Maternal Grandmother 37    Social History Social History  Substance Use Topics  . Smoking status: Former Research scientist (life sciences)  . Smokeless tobacco: Never Used  . Alcohol use No     Allergies   Iodine; Macrobid [nitrofurantoin monohyd macro]; Glipizide; and Sulfa antibiotics   Review of Systems Review of Systems  Constitutional: Negative for fever.  Respiratory: Positive for shortness of breath.   Cardiovascular: Positive for chest pain. Negative for leg swelling.  Gastrointestinal: Negative for abdominal pain, diarrhea, nausea and vomiting.  Neurological: Positive for light-headedness and headaches. Negative for dizziness.  All other systems reviewed and are negative.    Physical Exam Updated Vital Signs BP (!) 144/71   Pulse 67   Temp 98.8 F (37.1 C) (Oral)   Resp 15   SpO2 98%   Physical Exam  Constitutional: She is oriented to person, place, and time. She appears well-developed and well-nourished.  Overweight  HENT:  Head: Normocephalic and atraumatic.  Eyes: Pupils are equal, round, and reactive to light.  Cardiovascular: Normal rate, regular rhythm and normal heart sounds.   Pulmonary/Chest: Effort normal. No respiratory distress. She has no wheezes.  Abdominal: Soft. Bowel sounds are normal. There is no tenderness. There is no guarding.  Musculoskeletal:  Trace bilateral lower extremity edema  Neurological: She is alert and oriented to person, place, and time.  Skin: Skin is warm and dry.  Psychiatric: She has a normal mood and affect.  Nursing note and vitals reviewed.    ED Treatments / Results  Labs (all labs ordered are listed, but only abnormal results are displayed) Labs Reviewed  BASIC METABOLIC PANEL - Abnormal; Notable for the following:       Result Value   Glucose, Bld 244 (*)    Creatinine, Ser 1.09 (*)    GFR calc non Af  Amer 56 (*)    All other components within normal limits  CBC  HEPATIC FUNCTION PANEL  LIPASE, BLOOD  I-STAT TROPOININ, ED  I-STAT TROPOININ, ED    EKG  EKG Interpretation  Date/Time:  Monday May 17 2016 21:39:29 EDT Ventricular Rate:  72 PR Interval:  156 QRS Duration: 80 QT Interval:  396 QTC Calculation: 433 R Axis:   17 Text Interpretation:  Normal sinus rhythm Nonspecific ST abnormality Abnormal ECG Confirmed by Thayer Jew [phone]) on 05/18/2016 1:47:21 AM       Radiology Dg Chest 2 View  Result Date: 05/17/2016 CLINICAL DATA:  Chest pain/ tightness. Hypertension. Shortness of breath. EXAM: CHEST  2 VIEW COMPARISON:  None. FINDINGS: Heart size at the upper limits normal, mediastinal contours are normal. The lungs are clear. Pulmonary vasculature is normal. No consolidation, pleural effusion, or pneumothorax. No acute osseous abnormalities are seen. IMPRESSION: Borderline cardiomegaly.  No acute pulmonary process. Electronically Signed   By: Fonnie Birkenhead.D.  On: 05/17/2016 22:22    Procedures Procedures (including critical care time)  Medications Ordered in ED Medications  nitroGLYCERIN (NITROGLYN) 2 % ointment 1 inch (1 inch Topical Given 05/18/16 0237)  aspirin chewable tablet 324 mg (324 mg Oral Given 05/18/16 0237)  hydrALAZINE (APRESOLINE) injection 5 mg (5 mg Intravenous Given 05/18/16 0250)     Initial Impression / Assessment and Plan / ED Course  I have reviewed the triage vital signs and the nursing notes.  Pertinent labs & imaging results that were available during my care of the patient were reviewed by me and considered in my medical decision making (see chart for details).     Patient presents with hypertension, lightheadedness, chest pain. She is nontoxic-appearing on exam. Initially blood pressure elevated. Patient was given hydralazine. EKG is nonischemic. Initial troponin is negative. She reports that she occasionally has pain that is  associated with eating. She does have some chest tightness at this time. Somewhat atypical. Heart score is 3 for age and risk factors. Given atypical nature, Will delta troponin. No evidence of arrhythmia on EKG. After treatment of blood pressure, patient reports improvement of symptoms. She is no longer lightheaded. Chest pain improved. Vital signs reassuring with blood pressure 144/71. Suspect lightheadedness likely related to blood pressure. Chest pain is more difficult to tease out. Repeat troponin is negative. Suspect noncardiac chest pain; however, patient certainly has risk factors. Recommend follow-up closely with cardiology for stress testing. Patient encouraged to return for any new or worsening symptoms.  After history, exam, and medical workup I feel the patient has been appropriately medically screened and is safe for discharge home. Pertinent diagnoses were discussed with the patient. Patient was given return precautions.   Final Clinical Impressions(s) / ED Diagnoses   Final diagnoses:  Essential hypertension  Atypical chest pain  Lightheadedness    New Prescriptions New Prescriptions   No medications on file   I personally performed the services described in this documentation, which was scribed in my presence. The recorded information has been reviewed and is accurate.    Merryl Hacker, MD 05/18/16 [phone]

## 2016-05-19 ENCOUNTER — Encounter (HOSPITAL_COMMUNITY): Payer: Self-pay | Admitting: Family Medicine

## 2016-05-19 ENCOUNTER — Ambulatory Visit (HOSPITAL_COMMUNITY)
Admission: EM | Admit: 2016-05-19 | Discharge: 2016-05-19 | Disposition: A | Discharge status: Home / Self Care | Payer: Self-pay | Attending: Family Medicine | Admitting: Family Medicine

## 2016-05-19 DIAGNOSIS — K219 Gastro-esophageal reflux disease without esophagitis: Secondary | ICD-10-CM

## 2016-05-19 DIAGNOSIS — I10 Essential (primary) hypertension: Secondary | ICD-10-CM

## 2016-05-19 MED ORDER — RANITIDINE HCL 150 MG PO TABS: 150.0000 mg | ORAL_TABLET | Freq: Two times a day (BID) | ORAL | 0 refills | Status: DC

## 2016-05-19 NOTE — Discharge Instructions (Signed)
Follow up with your primary care doctor in the next 2 weeks °

## 2016-05-19 NOTE — ED Triage Notes (Addendum)
Pt sts that she took her BP today and was 170/111. sts she was in the ER on Monday and it was 192/97. sts she was suppose to go to the doctor yesterday but she doesn't have insurance. sts she took Lisinopril last night. sts she is stil felling dizzy and having tingling in hand. She tried to get into her primary today but no appointments.

## 2016-05-19 NOTE — ED Provider Notes (Signed)
[facility]    CSN: [id number] Arrival date & time: 05/19/16  1752     History   Chief Complaint Chief Complaint  Patient presents with  . Hypertension    HPI Tanya Ball is a 56 y.o. female.   Pt sts that she took her BP today and was 170/111. sts she was in the ER on Monday and it was 192/97. sts she was suppose to go to the doctor yesterday but she doesn't have insurance. sts she took Lisinopril last night. sts she is stil felling dizzy and having tingling in hand.   Patient also has chronic heartburn and this is been an issue for her recently.  Patient had her blood pressure check to friend's home today and it was elevated so she was concerned that she might need a bigger dose of the lisinopril.      Past Medical History:  Diagnosis Date  . ADD (attention deficit disorder) 2002  . Arthritis 2006   knees, fingers   . Asthma 2004  . Diabetes mellitus without complication ([place]) 4098  . Hypertension 2009  . Hypothyroidism 2008    previously on levothyroxine   . Sciatica     Patient Active Problem List   Diagnosis Date Noted  . HTN (hypertension) 08/18/2015  . Left sided abdominal pain 08/01/2015  . Shortness of breath 08/01/2015  . Obesity (BMI 30-39.9) 09/05/2013  . ADD (attention deficit disorder) 09/05/2013  . Depression 09/05/2013  . Subclinical hypothyroidism   . DM type 2, uncontrolled, with neuropathy [facility])     Past Surgical History:  Procedure Laterality Date  . ENDOMETRIAL ABLATION  2006   . KNEE SURGERY  2012, 2004    both knees, meniscus surgery     OB History    No data available       Home Medications    Prior to Admission medications   Medication Sig Start Date End Date Taking? Authorizing Provider  ACCU-CHEK FASTCLIX LANCETS MISC 1 each by Does not apply route 3 (three) times daily. 09/20/13   Funches, Adriana Mccallum, MD  albuterol (PROVENTIL HFA;VENTOLIN HFA) 108 (90 Base) MCG/ACT inhaler Inhale 2 puffs into the lungs  every 6 (six) hours as needed for wheezing or shortness of breath. 08/01/15   Funches, Adriana Mccallum, MD  atorvastatin (LIPITOR) 10 MG tablet Take 1 tablet (10 mg total) by mouth daily. Patient not taking: Reported on 07/22/2015 09/06/13   Boykin Nearing, MD  Blood Glucose Monitoring Suppl (ACCU-CHEK NANO SMARTVIEW) W/DEVICE KIT 1 Device by Does not apply route as needed. 09/20/13   Funches, Adriana Mccallum, MD  glucose blood (ACCU-CHEK SMARTVIEW) test strip 1 each by Other route 5 (five) times daily. 09/20/13   Funches, Adriana Mccallum, MD  levothyroxine (SYNTHROID, LEVOTHROID) 75 MCG tablet TAKE 1 TABLET BY MOUTH DAILY. 03/09/16   Funches, Adriana Mccallum, MD  lisinopril (PRINIVIL,ZESTRIL) 10 MG tablet Take 1 tablet (10 mg total) by mouth daily. 10/22/15   Funches, Adriana Mccallum, MD  metFORMIN (GLUCOPHAGE-XR) 500 MG 24 hr tablet TAKE 2 TABLETS BY MOUTH DAILY WITH BREAKFAST. 05/07/16   Funches, Adriana Mccallum, MD  naproxen sodium (ANAPROX) 220 MG tablet Take 220 mg by mouth 2 (two) times daily with a meal.    [provider]  ranitidine (ZANTAC) 150 MG tablet Take 1 tablet (150 mg total) by mouth 2 (two) times daily. 05/19/16   Robyn Haber, MD    Family History Family History  Problem Relation Age of Onset  . Diabetes Mother   . Liver disease Father   .  Cancer Brother        Hodgkin's Lymphoma   . Aneurysm Maternal Grandmother 68    Social History Social History  Substance Use Topics  . Smoking status: Former Research scientist (life sciences)  . Smokeless tobacco: Never Used  . Alcohol use No     Allergies   Iodine; Macrobid [nitrofurantoin monohyd macro]; Glipizide; and Sulfa antibiotics   Review of Systems Review of Systems  Respiratory: Positive for chest tightness.   All other systems reviewed and are negative.    Physical Exam Triage Vital Signs ED Triage Vitals  Enc Vitals Group     BP      Pulse      Resp      Temp      Temp src      SpO2      Weight      Height      Head Circumference      Peak Flow      Pain Score       Pain Loc      Pain Edu?      Excl. in [place]?    No data found.   Updated Vital Signs BP (!) 155/70   Pulse 76   Temp 98.5 F (36.9 C)   Resp 18   SpO2 97%    Physical Exam  Constitutional: She is oriented to person, place, and time. She appears well-developed and well-nourished.  HENT:  Right Ear: External ear normal.  Left Ear: External ear normal.  Mouth/Throat: Oropharynx is clear and moist.  Eyes: Conjunctivae are normal. Pupils are equal, round, and reactive to light.  Neck: Normal range of motion. Neck supple.  Cardiovascular: Normal rate, regular rhythm and normal heart sounds.   Pulmonary/Chest: Effort normal and breath sounds normal.  Musculoskeletal: Normal range of motion.  Neurological: She is alert and oriented to person, place, and time.  Skin: Skin is warm and dry.  Nursing note and vitals reviewed.    UC Treatments / Results  Labs (all labs ordered are listed, but only abnormal results are displayed) Labs Reviewed - No data to display  EKG  EKG Interpretation None       Radiology Dg Chest 2 View  Result Date: 05/17/2016 CLINICAL DATA:  Chest pain/ tightness. Hypertension. Shortness of breath. EXAM: CHEST  2 VIEW COMPARISON:  None. FINDINGS: Heart size at the upper limits normal, mediastinal contours are normal. The lungs are clear. Pulmonary vasculature is normal. No consolidation, pleural effusion, or pneumothorax. No acute osseous abnormalities are seen. IMPRESSION: Borderline cardiomegaly.  No acute pulmonary process. Electronically Signed   By: Jeb Levering M.D.   On: 05/17/2016 22:22    Procedures Procedures (including critical care time)  Medications Ordered in UC Medications - No data to display   Initial Impression / Assessment and Plan / UC Course  I have reviewed the triage vital signs and the nursing notes.  Pertinent labs & imaging results that were available during my care of the patient were reviewed by me and considered  in my medical decision making (see chart for details).     Final Clinical Impressions(s) / UC Diagnoses   Final diagnoses:  Essential hypertension  Gastroesophageal reflux disease without esophagitis    New Prescriptions New Prescriptions   RANITIDINE (ZANTAC) 150 MG TABLET    Take 1 tablet (150 mg total) by mouth 2 (two) times daily.     Robyn Haber, MD 05/19/16 [phone]

## 2016-05-27 ENCOUNTER — Encounter: Payer: Self-pay | Admitting: Physician Assistant

## 2016-05-27 ENCOUNTER — Ambulatory Visit: Payer: Self-pay | Attending: Internal Medicine | Admitting: Physician Assistant

## 2016-05-27 VITALS — BP 138/79 | HR 71 | Temp 98.0°F | Resp 16 | Wt 244.4 lb

## 2016-05-27 DIAGNOSIS — J45909 Unspecified asthma, uncomplicated: Secondary | ICD-10-CM | POA: Insufficient documentation

## 2016-05-27 DIAGNOSIS — Z888 Allergy status to other drugs, medicaments and biological substances status: Secondary | ICD-10-CM | POA: Insufficient documentation

## 2016-05-27 DIAGNOSIS — E039 Hypothyroidism, unspecified: Secondary | ICD-10-CM | POA: Insufficient documentation

## 2016-05-27 DIAGNOSIS — Z882 Allergy status to sulfonamides status: Secondary | ICD-10-CM | POA: Insufficient documentation

## 2016-05-27 DIAGNOSIS — E1140 Type 2 diabetes mellitus with diabetic neuropathy, unspecified: Secondary | ICD-10-CM | POA: Insufficient documentation

## 2016-05-27 DIAGNOSIS — M179 Osteoarthritis of knee, unspecified: Secondary | ICD-10-CM | POA: Insufficient documentation

## 2016-05-27 DIAGNOSIS — I10 Essential (primary) hypertension: Secondary | ICD-10-CM | POA: Insufficient documentation

## 2016-05-27 DIAGNOSIS — IMO0002 Reserved for concepts with insufficient information to code with codable children: Secondary | ICD-10-CM

## 2016-05-27 DIAGNOSIS — M19049 Primary osteoarthritis, unspecified hand: Secondary | ICD-10-CM | POA: Insufficient documentation

## 2016-05-27 DIAGNOSIS — Z7951 Long term (current) use of inhaled steroids: Secondary | ICD-10-CM | POA: Insufficient documentation

## 2016-05-27 DIAGNOSIS — Z79899 Other long term (current) drug therapy: Secondary | ICD-10-CM | POA: Insufficient documentation

## 2016-05-27 DIAGNOSIS — R42 Dizziness and giddiness: Secondary | ICD-10-CM | POA: Insufficient documentation

## 2016-05-27 DIAGNOSIS — R0789 Other chest pain: Secondary | ICD-10-CM | POA: Insufficient documentation

## 2016-05-27 DIAGNOSIS — E1165 Type 2 diabetes mellitus with hyperglycemia: Secondary | ICD-10-CM

## 2016-05-27 DIAGNOSIS — Z7984 Long term (current) use of oral hypoglycemic drugs: Secondary | ICD-10-CM | POA: Insufficient documentation

## 2016-05-27 LAB — GLUCOSE, POCT (MANUAL RESULT ENTRY): POC Glucose: 209 mg/dl — AB (ref 70–99)

## 2016-05-27 LAB — POCT GLYCOSYLATED HEMOGLOBIN (HGB A1C): HEMOGLOBIN A1C: 9.8

## 2016-05-27 MED ORDER — FLUTICASONE PROPIONATE 50 MCG/ACT NA SUSP: 2.0000 | Freq: Every day | NASAL | 6 refills | Status: DC

## 2016-05-27 MED ORDER — LISINOPRIL 20 MG PO TABS: 20.0000 mg | ORAL_TABLET | Freq: Every day | ORAL | 3 refills | Status: DC

## 2016-05-27 MED ORDER — GLUCOSE BLOOD VI STRP: ORAL_STRIP | 12 refills | Status: DC

## 2016-05-27 MED ORDER — GLUCOSE BLOOD VI STRP: 1.0000 | ORAL_STRIP | Freq: Every day | 11 refills | Status: DC

## 2016-05-27 MED FILL — TRUE METRIX TEST STRIP: 25 days supply | Qty: 100 | Fill #0

## 2016-05-27 MED FILL — ?LISINOPRIL 20 MG TABLET: 20 | 30 days supply | Qty: 30 | Fill #0

## 2016-05-27 MED FILL — FLUTICASONE PROP 50 MCG SPR: 50 | 30 days supply | Qty: 16 | Fill #0

## 2016-05-27 NOTE — Progress Notes (Signed)
Patient ID: Tanya Ball, female   DOB: December 26, 1960, 56 y.o.   MRN: [id number]       Tanya Ball, is a 56 y.o. female  [id number]  [id number]  DOB - 01-02-1961  Subjective:  Chief Complaint and HPI: Tanya Ball is a 56 y.o. female here today to establish care and for a follow up visit ED 05/18/2016 for elevated BP and: From ED note: history of asthma, HTN, hypothyroidism, and diabetes who presents to the ED for evaluation of multiple complaints. This patient states that several hours ago she became lightheaded with racing heartbeat while at work. She took her blood pressure at this time and measured to 237 systolic. She then presented to this facility for workup. Does have history of hypertension; she is taking Lisinopril. At interview, she is also reporting mild dyspnea, mild headache, and 7/10 sternal "tightness" that is non-radiating. Reports that she occasionally has chest pain. It is lower chest pain and sometimes associated with food. She describes it as "bloating." No vertiginous complaints. No peripheral edema.  EKG showed no acute changes.  CBG was 244  ED summary 05/18/2016: Patient presents with hypertension, lightheadedness, chest pain. She is nontoxic-appearing on exam. Initially blood pressure elevated. Patient was given hydralazine. EKG is nonischemic. Initial troponin is negative. She reports that she occasionally has pain that is associated with eating. She does have some chest tightness at this time. Somewhat atypical. Heart score is 3 for age and risk factors. Given atypical nature, Will delta troponin. No evidence of arrhythmia on EKG. After treatment of blood pressure, patient reports improvement of symptoms. She is no longer lightheaded. Chest pain improved. Vital signs reassuring with blood pressure 144/71. Suspect lightheadedness likely related to blood pressure. Chest pain is more difficult to tease out. Repeat troponin is negative. Suspect  noncardiac chest pain; however, patient certainly has risk factors. Recommend follow-up closely with cardiology for stress testing. Patient encouraged to return for any new or worsening symptoms.   She has been feeling better since her ED visits.  She admits to poor compliance with her diabetes medications. Sometimes she doesn't take her meds every day and when she does, she often only takes one. She hasn't been taking her thyroid medications at all for several months.  She is having seasonal allergies.  She needs to get orange card and will get application today.  She denies further episodes of chest tightness.  ED/Hospital notes reviewed.   Social History:  Married, husband changing jobs  ROS:   Constitutional:  No f/c, No night sweats, No unexplained weight loss. EENT:  No vision changes, No blurry vision, No hearing changes. No mouth, throat, or ear problems.  Respiratory: No cough, No SOB Cardiac: No CP, no palpitations GI:  No abd pain, No N/V/D. GU: No Urinary s/sx Musculoskeletal: No joint pain Neuro: No headache, no dizziness, no motor weakness. Some light-headedness that has improved since ED visit Skin: No rash Endocrine:  No polydipsia. No polyuria.  Psych: Denies SI/HI  No problems updated.  ALLERGIES: Allergies  Allergen Reactions  . Iodine Hives  . Macrobid [Nitrofurantoin Monohyd Macro] Nausea Only  . Glipizide Other (See Comments)    Shaky and lightheaded   . Sulfa Antibiotics Rash    PAST MEDICAL HISTORY: Past Medical History:  Diagnosis Date  . ADD (attention deficit disorder) 2002  . Arthritis 2006   knees, fingers   . Asthma 2004  . Diabetes mellitus without complication ([place]) 6283  . Hypertension 2009  . Hypothyroidism 2008  previously on levothyroxine   . Sciatica     MEDICATIONS AT HOME: Prior to Admission medications   Medication Sig Start Date End Date Taking? Authorizing Provider  ACCU-CHEK FASTCLIX LANCETS MISC 1 each by Does not apply  route 3 (three) times daily. 09/20/13   Funches, Adriana Mccallum, MD  albuterol (PROVENTIL HFA;VENTOLIN HFA) 108 (90 Base) MCG/ACT inhaler Inhale 2 puffs into the lungs every 6 (six) hours as needed for wheezing or shortness of breath. 08/01/15   Funches, Adriana Mccallum, MD  atorvastatin (LIPITOR) 10 MG tablet Take 1 tablet (10 mg total) by mouth daily. Patient not taking: Reported on 07/22/2015 09/06/13   Boykin Nearing, MD  Blood Glucose Monitoring Suppl (ACCU-CHEK NANO SMARTVIEW) W/DEVICE KIT 1 Device by Does not apply route as needed. 09/20/13   Funches, Adriana Mccallum, MD  fluticasone (FLONASE) 50 MCG/ACT nasal spray Place 2 sprays into both nostrils daily. 05/27/16   Argentina Donovan, PA-C  glucose blood (ACCU-CHEK SMARTVIEW) test strip 1 each by Other route 5 (five) times daily. 05/27/16   Argentina Donovan, PA-C  levothyroxine (SYNTHROID, LEVOTHROID) 75 MCG tablet TAKE 1 TABLET BY MOUTH DAILY. 03/09/16   Funches, Adriana Mccallum, MD  lisinopril (PRINIVIL,ZESTRIL) 20 MG tablet Take 1 tablet (20 mg total) by mouth daily. 05/27/16   Argentina Donovan, PA-C  metFORMIN (GLUCOPHAGE-XR) 500 MG 24 hr tablet TAKE 2 TABLETS BY MOUTH DAILY WITH BREAKFAST. 05/07/16   Funches, Adriana Mccallum, MD  naproxen sodium (ANAPROX) 220 MG tablet Take 220 mg by mouth 2 (two) times daily with a meal.    [provider]  ranitidine (ZANTAC) 150 MG tablet Take 1 tablet (150 mg total) by mouth 2 (two) times daily. 05/19/16   Robyn Haber, MD     Objective:  EXAM:   Vitals:   05/27/16 0935  BP: 138/79  Pulse: 71  Resp: 16  Temp: 98 F (36.7 C)  TempSrc: Oral  SpO2: 99%  Weight: 244 lb 6.4 oz (110.9 kg)    General appearance : A&OX3. NAD. Non-toxic-appearing HEENT: Atraumatic and Normocephalic.  PERRLA. EOM intact.  TM clear B. Mouth-MMM, post pharynx WNL w/o erythema, No PND.  Turbinates enlarged and boggy Neck: supple, no JVD. No cervical lymphadenopathy. No thyromegaly Chest/Lungs:  Breathing-non-labored, Good air entry bilaterally,  breath sounds normal without rales, rhonchi, or wheezing  CVS: S1 S2 regular, no murmurs, gallops, rubs  Extremities: Bilateral Lower Ext shows no edema, both legs are warm to touch with = pulse throughout Neurology:  CN II-XII grossly intact, Non focal.   Psych:  TP linear. J/I WNL. Normal speech. Appropriate eye contact and affect.  Skin:  No Rash  Data Review Lab Results  Component Value Date   HGBA1C 9.8 05/27/2016   HGBA1C 10.2 08/01/2015   HGBA1C 9.6 09/05/2013     Assessment & Plan   1. Essential hypertension 2 ED visits recently Improving control; continue higher dose Lisinopril - Ambulatory referral to Cardiology - lisinopril (PRINIVIL,ZESTRIL) 20 MG tablet; Take 1 tablet (20 mg total) by mouth daily.  Dispense: 90 tablet; Refill: 3  2. DM type 2, uncontrolled, with neuropathy ([place]) Uncontrolled/poor compliance.  Sometimes she hasn't been taking metformin at all, sometimes 1 daily - Glucose (CBG) - HgB A1c - Ambulatory referral to Cardiology - glucose blood (ACCU-CHEK SMARTVIEW) test strip; 1 each by Other route 5 (five) times daily.  Dispense: 100 each; Refill: 11 Check glucose every morning fasting and record.  Make sure and take metformin daily AND at prescribed dose.  Bring readings  to next visit.  3. Dizziness She is going to work on getting orange card - Ambulatory referral to Cardiology  4. Hypothyroidism, unspecified type She hasn't been taking her meds for at least >2month - TSH     Patient have been counseled extensively about nutrition and exercise  Return in about 3 weeks (around 06/17/2016) for Dr FAdrian Blackwater DM f/up.  The patient was given clear instructions to call 911 if she develops CP, symptoms return/worsen or new problems develop. The patient verbalized understanding. The patient was told to call to get lab results if they haven't heard anything in the next week.     AFreeman Caldron PA-C [facility] [place]  05/27/2016, 10:13 AM She returned to the ED 05/19/2016 for continued elevated BP at home and dizziness with hand paresthesias.  Her BP in the ED was 155/70.

## 2016-05-28 LAB — TSH: TSH: 4.73 u[IU]/mL — ABNORMAL HIGH (ref 0.450–4.500)

## 2016-06-04 ENCOUNTER — Telehealth: Payer: Self-pay

## 2016-06-04 NOTE — Telephone Encounter (Signed)
Contacted pt to go over lab results pt didn't answer lvm asking pt to give me a call at her earliest convenience   If pt calls back please give results: Her thyroid is only mildly abnormal currently. Have her take the synthroid M, W, F and we will recheck this in about 8 weeks.

## 2016-06-08 ENCOUNTER — Telehealth: Payer: Self-pay | Admitting: Family Medicine

## 2016-06-08 NOTE — Telephone Encounter (Signed)
Patient called to review results, results were read to patient but has more questions , please f/up

## 2016-06-09 ENCOUNTER — Telehealth: Payer: Self-pay

## 2016-06-09 MED ORDER — GLUCOSE BLOOD VI STRP: ORAL_STRIP | 12 refills | Status: DC

## 2016-06-09 MED FILL — TRUE METRIX TEST STRIP: 25 days supply | Qty: 100 | Fill #0

## 2016-06-09 NOTE — Telephone Encounter (Signed)
Test strips resent to pharmacy  

## 2016-06-09 NOTE — Telephone Encounter (Signed)
Pt was called and informed of refill being sent to pharmacy. 

## 2016-06-09 NOTE — Telephone Encounter (Signed)
Pt. Called stating that she was prescribed the wrong test strips. Pt. States that she needs the true test (quad electrode laser accuracy). Pt. Uses [facility] pharmacy. Please f/u with pt.

## 2016-06-15 ENCOUNTER — Other Ambulatory Visit: Payer: Self-pay | Admitting: Family Medicine

## 2016-06-15 DIAGNOSIS — IMO0002 Reserved for concepts with insufficient information to code with codable children: Secondary | ICD-10-CM

## 2016-06-15 DIAGNOSIS — E1165 Type 2 diabetes mellitus with hyperglycemia: Principal | ICD-10-CM

## 2016-06-15 DIAGNOSIS — E1140 Type 2 diabetes mellitus with diabetic neuropathy, unspecified: Secondary | ICD-10-CM

## 2016-06-15 MED ORDER — TRUE METRIX METER W/DEVICE KIT: 1.0000 | PACK | 0 refills | Status: DC | PRN

## 2016-06-18 ENCOUNTER — Other Ambulatory Visit: Payer: Self-pay | Admitting: Family Medicine

## 2016-06-18 DIAGNOSIS — E1140 Type 2 diabetes mellitus with diabetic neuropathy, unspecified: Secondary | ICD-10-CM

## 2016-06-18 DIAGNOSIS — IMO0002 Reserved for concepts with insufficient information to code with codable children: Secondary | ICD-10-CM

## 2016-06-18 DIAGNOSIS — E1165 Type 2 diabetes mellitus with hyperglycemia: Principal | ICD-10-CM

## 2016-06-18 MED ORDER — TRUE METRIX METER W/DEVICE KIT: 1.0000 | PACK | 0 refills | Status: DC | PRN

## 2016-06-18 MED FILL — !TRUE METRIX BLOOD GLUCOSE: 30 days supply | Qty: 1 | Fill #0

## 2016-06-24 ENCOUNTER — Ambulatory Visit: Payer: Self-pay | Admitting: Family Medicine

## 2016-06-28 ENCOUNTER — Other Ambulatory Visit: Payer: Self-pay | Admitting: Family Medicine

## 2016-06-28 DIAGNOSIS — IMO0002 Reserved for concepts with insufficient information to code with codable children: Secondary | ICD-10-CM

## 2016-06-28 DIAGNOSIS — E1165 Type 2 diabetes mellitus with hyperglycemia: Principal | ICD-10-CM

## 2016-06-28 DIAGNOSIS — E1140 Type 2 diabetes mellitus with diabetic neuropathy, unspecified: Secondary | ICD-10-CM

## 2016-06-28 MED FILL — METFORMIN HCL ER 500 MG TAB: 500 | 30 days supply | Qty: 60 | Fill #0

## 2016-06-28 MED FILL — LISINOPRIL 20 MG TABLET: 20 | 30 days supply | Qty: 30 | Fill #1

## 2016-08-05 ENCOUNTER — Ambulatory Visit: Payer: Self-pay | Attending: Family Medicine | Admitting: Family Medicine

## 2016-08-05 ENCOUNTER — Encounter: Payer: Self-pay | Admitting: Family Medicine

## 2016-08-05 VITALS — BP 130/89 | HR 73 | Temp 98.2°F | Ht 69.0 in | Wt 246.0 lb

## 2016-08-05 DIAGNOSIS — E1140 Type 2 diabetes mellitus with diabetic neuropathy, unspecified: Secondary | ICD-10-CM

## 2016-08-05 DIAGNOSIS — E038 Other specified hypothyroidism: Secondary | ICD-10-CM

## 2016-08-05 DIAGNOSIS — M25562 Pain in left knee: Secondary | ICD-10-CM

## 2016-08-05 DIAGNOSIS — G8929 Other chronic pain: Secondary | ICD-10-CM

## 2016-08-05 DIAGNOSIS — Z79899 Other long term (current) drug therapy: Secondary | ICD-10-CM | POA: Insufficient documentation

## 2016-08-05 DIAGNOSIS — Z7984 Long term (current) use of oral hypoglycemic drugs: Secondary | ICD-10-CM | POA: Insufficient documentation

## 2016-08-05 DIAGNOSIS — E1165 Type 2 diabetes mellitus with hyperglycemia: Secondary | ICD-10-CM

## 2016-08-05 DIAGNOSIS — M25552 Pain in left hip: Secondary | ICD-10-CM

## 2016-08-05 DIAGNOSIS — F329 Major depressive disorder, single episode, unspecified: Secondary | ICD-10-CM | POA: Insufficient documentation

## 2016-08-05 DIAGNOSIS — E039 Hypothyroidism, unspecified: Secondary | ICD-10-CM

## 2016-08-05 DIAGNOSIS — IMO0002 Reserved for concepts with insufficient information to code with codable children: Secondary | ICD-10-CM

## 2016-08-05 DIAGNOSIS — I10 Essential (primary) hypertension: Secondary | ICD-10-CM

## 2016-08-05 DIAGNOSIS — M25561 Pain in right knee: Secondary | ICD-10-CM

## 2016-08-05 DIAGNOSIS — Z87891 Personal history of nicotine dependence: Secondary | ICD-10-CM | POA: Insufficient documentation

## 2016-08-05 DIAGNOSIS — Z760 Encounter for issue of repeat prescription: Secondary | ICD-10-CM | POA: Insufficient documentation

## 2016-08-05 LAB — POCT GLYCOSYLATED HEMOGLOBIN (HGB A1C): HEMOGLOBIN A1C: 9.3

## 2016-08-05 LAB — GLUCOSE, POCT (MANUAL RESULT ENTRY): POC GLUCOSE: 206 mg/dL — AB (ref 70–99)

## 2016-08-05 MED ORDER — METHYLPREDNISOLONE ACETATE 40 MG/ML IJ SUSP
40.0000 mg | Freq: Once | INTRAMUSCULAR | Status: AC
  Administered 2016-08-05: 40 mg via INTRA_ARTICULAR

## 2016-08-05 MED ORDER — GLIMEPIRIDE 2 MG PO TABS: ORAL_TABLET | ORAL | 3 refills | Status: DC

## 2016-08-05 MED ORDER — LISINOPRIL 20 MG PO TABS: 20.0000 mg | ORAL_TABLET | Freq: Every day | ORAL | 3 refills | Status: DC

## 2016-08-05 MED ORDER — NAPROXEN 500 MG PO TABS: 500.0000 mg | ORAL_TABLET | Freq: Two times a day (BID) | ORAL | 0 refills | Status: DC

## 2016-08-05 MED ORDER — LEVOTHYROXINE SODIUM 75 MCG PO TABS: 75.0000 ug | ORAL_TABLET | Freq: Every day | ORAL | 2 refills | Status: DC

## 2016-08-05 MED ORDER — CYCLOBENZAPRINE HCL 10 MG PO TABS: 10.0000 mg | ORAL_TABLET | Freq: Three times a day (TID) | ORAL | 0 refills | Status: DC | PRN

## 2016-08-05 MED ORDER — METFORMIN HCL ER 500 MG PO TB24: 1000.0000 mg | ORAL_TABLET | Freq: Every day | ORAL | 5 refills | Status: DC

## 2016-08-05 MED FILL — LISINOPRIL 20 MG TAB: 20 | 30 days supply | Qty: 30 | Fill #0

## 2016-08-05 MED FILL — ?CYCLOBENZAPRINE 10 MG TABL: 10 | 10 days supply | Qty: 30 | Fill #0

## 2016-08-05 MED FILL — NAPROXEN 500 MG TABLET: 500 | 15 days supply | Qty: 30 | Fill #0

## 2016-08-05 MED FILL — METFORMIN HCL ER 500 MG TAB: 500 | 30 days supply | Qty: 60 | Fill #0

## 2016-08-05 MED FILL — ?GLIMEPIRIDE 2 MG TABLET: 2 | 30 days supply | Qty: 60 | Fill #0

## 2016-08-05 MED FILL — LEVOTHYROXINE 75 MCG TABLET: 75 | 30 days supply | Qty: 30 | Fill #0

## 2016-08-05 NOTE — Assessment & Plan Note (Signed)
A: improved but still uncontrolled P: Continue metformin 1000 mg daily Add Amaryl 2 mg daily

## 2016-08-05 NOTE — Assessment & Plan Note (Signed)
Left hip pain X-ray ordered Flexeril and naproxen for pain control

## 2016-08-05 NOTE — Progress Notes (Signed)
Subjective:  Patient ID: Tanya Ball, female    DOB: [birth year]  Age: 56 y.o. MRN: [id number]  CC: Medication Refill   HPI Tanya Ball has HTN,  diabetes and hypothyroidism she  presents for   1. Interested in applying for disability: she works [employer]. She reports L sided groin pain, bilateral knee pain. Shooting pain in her bilateral  big toes. Pain in R 4th finger. She has pain at rest and with walking. She reports pain has been severe for 2 weeks. She takes glucosamine and chondroitin for joint pain. She has tried tumeric but noticed increased stiffness.   2. Diabetes: taking metformin 1000 mg daily. Having pains in feet. No vision changes. No polyuria or polydipsia.   3. Hypothyroidism: she stopped taking synthroid when she was told to reduce dose to. She admits to joint pains and depressed mood.   Social History  Substance Use Topics  . Smoking status: Former Research scientist (life sciences)  . Smokeless tobacco: Never Used  . Alcohol use No    Outpatient Medications Prior to Visit  Medication Sig Dispense Refill  . ACCU-CHEK FASTCLIX LANCETS MISC 1 each by Does not apply route 3 (three) times daily. 102 each 11  . albuterol (PROVENTIL HFA;VENTOLIN HFA) 108 (90 Base) MCG/ACT inhaler Inhale 2 puffs into the lungs every 6 (six) hours as needed for wheezing or shortness of breath. 1 Inhaler 0  . Blood Glucose Monitoring Suppl (TRUE METRIX METER) w/Device KIT 1 each by Does not apply route as needed. 1 kit 0  . fluticasone (FLONASE) 50 MCG/ACT nasal spray Place 2 sprays into both nostrils daily. 16 g 6  . glucose blood (TRUE METRIX BLOOD GLUCOSE TEST) test strip Use as instructed 100 each 12  . levothyroxine (SYNTHROID, LEVOTHROID) 75 MCG tablet TAKE 1 TABLET BY MOUTH DAILY. 30 tablet 0  . lisinopril (PRINIVIL,ZESTRIL) 20 MG tablet Take 1 tablet (20 mg total) by mouth daily. 90 tablet 3  . metFORMIN (GLUCOPHAGE-XR) 500 MG 24 hr tablet TAKE 2 TABLETS BY MOUTH DAILY WITH BREAKFAST. 60 tablet 0   . naproxen sodium (ANAPROX) 220 MG tablet Take 220 mg by mouth 2 (two) times daily with a meal.    . atorvastatin (LIPITOR) 10 MG tablet Take 1 tablet (10 mg total) by mouth daily. (Patient not taking: Reported on 07/22/2015) 90 tablet 3  . ranitidine (ZANTAC) 150 MG tablet Take 1 tablet (150 mg total) by mouth 2 (two) times daily. (Patient not taking: Reported on 08/05/2016) 60 tablet 0   No facility-administered medications prior to visit.     ROS Review of Systems  Constitutional: Negative for chills and fever.  Eyes: Negative for visual disturbance.  Respiratory: Negative for cough and shortness of breath.   Cardiovascular: Negative for chest pain, palpitations and leg swelling.  Gastrointestinal: Negative for abdominal pain, blood in stool and constipation.  Musculoskeletal: Positive for arthralgias and back pain.  Skin: Negative for rash.  Allergic/Immunologic: Negative for immunocompromised state.  Hematological: Negative for adenopathy. Does not bruise/bleed easily.  Psychiatric/Behavioral: Negative for dysphoric mood and suicidal ideas.    Objective:  BP 130/89   Pulse 73   Temp 98.2 F (36.8 C) (Oral)   Ht '5\' 9"'  (1.753 m)   Wt 246 lb (111.6 kg)   SpO2 98%   BMI 36.33 kg/m   BP/Weight 08/05/2016 05/27/2016 1/32/4401  Systolic BP 027 253 664  Diastolic BP 89 79 70  Wt. (Lbs) 246 244.4 -  BMI 36.33 36.09 -    Physical  Exam  Constitutional: She is oriented to person, place, and time. She appears well-developed and well-nourished. No distress.  Obese   HENT:  Head: Normocephalic and atraumatic.  Cardiovascular: Normal rate, regular rhythm, normal heart sounds and intact distal pulses.   Pulmonary/Chest: Effort normal and breath sounds normal.  Musculoskeletal: She exhibits no edema.       Left hip: She exhibits decreased range of motion. She exhibits normal strength, no tenderness, no bony tenderness, no swelling, no crepitus, no deformity and no laceration.        Left knee: She exhibits no swelling. Tenderness found. Medial joint line tenderness noted.  Neurological: She is alert and oriented to person, place, and time.  Skin: Skin is warm and dry. No rash noted.  Psychiatric: She has a normal mood and affect.   After obtaining informed consent and cleaning the skin using iodine and alcohol a  steroid injection was performed at left knee.  Using 1:1 , 1 ml of  1% plain Lidocaine and 40 mg/ml of Depo Medrol. This was well tolerated.   Lab Results  Component Value Date   TSH 4.730 (H) 05/27/2016    Lab Results  Component Value Date   HGBA1C 9.3 08/05/2016   CBG 206  Assessment & Plan:   Joscelyn was seen today for medication refill.  Diagnoses and all orders for this visit:  DM type 2, uncontrolled, with neuropathy ([place]) -     POCT glucose (manual entry) -     POCT glycosylated hemoglobin (Hb A1C) -     glimepiride (AMARYL) 2 MG tablet; Take 2 mg in the morning before breakfast  for one week, then increase to 4 mg -     metFORMIN (GLUCOPHAGE-XR) 500 MG 24 hr tablet; Take 2 tablets (1,000 mg total) by mouth daily with breakfast.  Chronic pain of left knee -     methylPREDNISolone acetate (DEPO-MEDROL) injection 40 mg; Inject 1 mL (40 mg total) into the articular space once. -     cyclobenzaprine (FLEXERIL) 10 MG tablet; Take 1 tablet (10 mg total) by mouth 3 (three) times daily as needed for muscle spasms.  Bilateral chronic knee pain -     DG Knee Bilateral Standing AP; Future -     naproxen (NAPROSYN) 500 MG tablet; Take 1 tablet (500 mg total) by mouth 2 (two) times daily with a meal. -     cyclobenzaprine (FLEXERIL) 10 MG tablet; Take 1 tablet (10 mg total) by mouth 3 (three) times daily as needed for muscle spasms.  Chronic left hip pain -     DG HIP UNILAT WITH PELVIS 2-3 VIEWS LEFT; Future -     naproxen (NAPROSYN) 500 MG tablet; Take 1 tablet (500 mg total) by mouth 2 (two) times daily with a meal. -     cyclobenzaprine  (FLEXERIL) 10 MG tablet; Take 1 tablet (10 mg total) by mouth 3 (three) times daily as needed for muscle spasms.  Subclinical hypothyroidism -     levothyroxine (SYNTHROID, LEVOTHROID) 75 MCG tablet; Take 1 tablet (75 mcg total) by mouth daily.  Essential hypertension -     lisinopril (PRINIVIL,ZESTRIL) 20 MG tablet; Take 1 tablet (20 mg total) by mouth daily.   No orders of the defined types were placed in this encounter.   Follow-up: Return in about 8 weeks (around 09/30/2016) for diabetes, hypothyroidism, joint pains .   Boykin Nearing MD

## 2016-08-05 NOTE — Patient Instructions (Addendum)
Tanya CornfieldStephanie was seen today for medication refill.  Diagnoses and all orders for this visit:  DM type 2, uncontrolled, with neuropathy ([facility]) -     POCT glucose (manual entry) -     POCT glycosylated hemoglobin (Hb A1C) -     glimepiride (AMARYL) 2 MG tablet; Take 2 mg in the morning before breakfast  for one week, then increase to 4 mg -     metFORMIN (GLUCOPHAGE-XR) 500 MG 24 hr tablet; Take 2 tablets (1,000 mg total) by mouth daily with breakfast.  Chronic pain of left knee -     methylPREDNISolone acetate (DEPO-MEDROL) injection 40 mg; Inject 1 mL (40 mg total) into the articular space once.  Bilateral chronic knee pain -     DG Knee Bilateral Standing AP; Future -     naproxen (NAPROSYN) 500 MG tablet; Take 1 tablet (500 mg total) by mouth 2 (two) times daily with a meal.  Chronic left hip pain -     DG HIP UNILAT WITH PELVIS 2-3 VIEWS LEFT; Future -     naproxen (NAPROSYN) 500 MG tablet; Take 1 tablet (500 mg total) by mouth 2 (two) times daily with a meal.  Subclinical hypothyroidism -     levothyroxine (SYNTHROID, LEVOTHROID) 75 MCG tablet; Take 1 tablet (75 mcg total) by mouth daily.   You have received a shot of steroid in your joint today. Rest and ice knee today. Regular activity tomorrow. Look out for redness, swelling, fever,severe pain in joint and call if you experience these symptoms.   Restart synthroid every day  F/u in 8 weeks for joint pains, diabetes and hypothyroidism   Dr. Armen PickupFunches

## 2016-08-05 NOTE — Assessment & Plan Note (Signed)
X-rays ordered Depo medrol shot in left knee

## 2016-08-05 NOTE — Assessment & Plan Note (Signed)
Restart synthroid F/u TSH in 8-12 weeks

## 2016-09-21 MED FILL — LISINOPRIL 20 MG TAB: 20 | 30 days supply | Qty: 30 | Fill #1

## 2016-09-21 MED FILL — LEVOTHYROXINE 75 MCG TABLET: 75 | 30 days supply | Qty: 30 | Fill #1

## 2016-09-21 MED FILL — METFORMIN HCL ER 500 MG TAB: 500 | 30 days supply | Qty: 60 | Fill #1

## 2016-09-21 MED FILL — ?GLIMEPIRIDE 2 MG TABLET: 2 | 30 days supply | Qty: 60 | Fill #1

## 2016-11-02 MED FILL — ?GLIMEPIRIDE 2 MG TABLET: 2 | 30 days supply | Qty: 60 | Fill #2

## 2016-11-02 MED FILL — METFORMIN HCL ER 500 MG TAB: 500 | 30 days supply | Qty: 60 | Fill #2

## 2016-11-02 MED FILL — LISINOPRIL 20 MG TAB: 20 | 30 days supply | Qty: 30 | Fill #2

## 2016-11-02 MED FILL — LEVOTHYROXINE 75 MCG TABLET: 75 | 30 days supply | Qty: 30 | Fill #2

## 2016-12-09 MED FILL — ?GLIMEPIRIDE 2 MG TABLET: 2 | 30 days supply | Qty: 60 | Fill #3

## 2016-12-09 MED FILL — LISINOPRIL 20 MG TAB: 20 | 30 days supply | Qty: 30 | Fill #3

## 2016-12-09 MED FILL — METFORMIN HCL ER 500 MG TAB: 500 | 30 days supply | Qty: 60 | Fill #3

## 2017-02-16 MED FILL — METFORMIN HCL ER 500 MG TAB: 500 | 30 days supply | Qty: 60 | Fill #4

## 2017-02-16 MED FILL — LISINOPRIL 20 MG TAB: 20 | 30 days supply | Qty: 30 | Fill #4

## 2017-05-13 MED FILL — LISINOPRIL 20 MG TAB: 20 | 30 days supply | Qty: 30 | Fill #5

## 2017-05-13 MED FILL — METFORMIN HCL ER 500 MG TAB: 500 | 30 days supply | Qty: 60 | Fill #5

## 2017-07-05 ENCOUNTER — Ambulatory Visit: Payer: Self-pay | Attending: Internal Medicine | Admitting: Internal Medicine

## 2017-07-05 ENCOUNTER — Encounter: Payer: Self-pay | Admitting: Internal Medicine

## 2017-07-05 VITALS — BP 123/82 | HR 71 | Temp 98.3°F | Resp 16 | Ht 69.0 in | Wt 223.4 lb

## 2017-07-05 DIAGNOSIS — Z79899 Other long term (current) drug therapy: Secondary | ICD-10-CM | POA: Insufficient documentation

## 2017-07-05 DIAGNOSIS — Z79890 Hormone replacement therapy (postmenopausal): Secondary | ICD-10-CM | POA: Insufficient documentation

## 2017-07-05 DIAGNOSIS — Z833 Family history of diabetes mellitus: Secondary | ICD-10-CM | POA: Insufficient documentation

## 2017-07-05 DIAGNOSIS — M25552 Pain in left hip: Secondary | ICD-10-CM | POA: Insufficient documentation

## 2017-07-05 DIAGNOSIS — E039 Hypothyroidism, unspecified: Secondary | ICD-10-CM | POA: Insufficient documentation

## 2017-07-05 DIAGNOSIS — Z7984 Long term (current) use of oral hypoglycemic drugs: Secondary | ICD-10-CM | POA: Insufficient documentation

## 2017-07-05 DIAGNOSIS — G8929 Other chronic pain: Secondary | ICD-10-CM | POA: Insufficient documentation

## 2017-07-05 DIAGNOSIS — Z1231 Encounter for screening mammogram for malignant neoplasm of breast: Secondary | ICD-10-CM

## 2017-07-05 DIAGNOSIS — Z881 Allergy status to other antibiotic agents status: Secondary | ICD-10-CM | POA: Insufficient documentation

## 2017-07-05 DIAGNOSIS — Z882 Allergy status to sulfonamides status: Secondary | ICD-10-CM | POA: Insufficient documentation

## 2017-07-05 DIAGNOSIS — M25562 Pain in left knee: Secondary | ICD-10-CM | POA: Insufficient documentation

## 2017-07-05 DIAGNOSIS — I10 Essential (primary) hypertension: Secondary | ICD-10-CM | POA: Insufficient documentation

## 2017-07-05 DIAGNOSIS — E1140 Type 2 diabetes mellitus with diabetic neuropathy, unspecified: Secondary | ICD-10-CM | POA: Insufficient documentation

## 2017-07-05 DIAGNOSIS — Z888 Allergy status to other drugs, medicaments and biological substances status: Secondary | ICD-10-CM | POA: Insufficient documentation

## 2017-07-05 DIAGNOSIS — Z87891 Personal history of nicotine dependence: Secondary | ICD-10-CM | POA: Insufficient documentation

## 2017-07-05 DIAGNOSIS — Z1239 Encounter for other screening for malignant neoplasm of breast: Secondary | ICD-10-CM

## 2017-07-05 DIAGNOSIS — M25561 Pain in right knee: Secondary | ICD-10-CM | POA: Insufficient documentation

## 2017-07-05 DIAGNOSIS — Z7689 Persons encountering health services in other specified circumstances: Secondary | ICD-10-CM | POA: Insufficient documentation

## 2017-07-05 DIAGNOSIS — E119 Type 2 diabetes mellitus without complications: Secondary | ICD-10-CM

## 2017-07-05 DIAGNOSIS — Z2821 Immunization not carried out because of patient refusal: Secondary | ICD-10-CM | POA: Insufficient documentation

## 2017-07-05 DIAGNOSIS — E1165 Type 2 diabetes mellitus with hyperglycemia: Secondary | ICD-10-CM

## 2017-07-05 LAB — POCT GLYCOSYLATED HEMOGLOBIN (HGB A1C): HBA1C, POC (CONTROLLED DIABETIC RANGE): 8.1 % — AB (ref 0.0–7.0)

## 2017-07-05 LAB — GLUCOSE, POCT (MANUAL RESULT ENTRY): POC Glucose: 118 mg/dl — AB (ref 70–99)

## 2017-07-05 MED ORDER — GLUCOSE BLOOD VI STRP: ORAL_STRIP | 12 refills | Status: DC

## 2017-07-05 MED ORDER — TRUEPLUS LANCETS 28G MISC: 4 refills | Status: DC

## 2017-07-05 MED ORDER — TRUE METRIX METER W/DEVICE KIT: 1.0000 | PACK | 0 refills | Status: DC | PRN

## 2017-07-05 MED FILL — TRUE METRIX TEST STRIP: 30 days supply | Qty: 100 | Fill #0

## 2017-07-05 MED FILL — !TRUE METRIX BLOOD GLUCOSE: 365 days supply | Qty: 1 | Fill #0

## 2017-07-05 MED FILL — TRUEplus LANCETS 28G MISC: 30 days supply | Qty: 100 | Fill #0

## 2017-07-05 NOTE — Progress Notes (Signed)
Patient ID: Tanya Ball, female    DOB: 1960/12/18  MRN: [id number]  CC: re-establish; Diabetes; and Hypertension   Subjective: Tanya Ball is a 57 y.o. female who presents for chronic disease management and to establish with me as PCP.  Last PCP was Dr. Adrian Blackwater who has since left the practice. Her concerns today include:  HTN, DM, hypothyroid, HL, BL knee pain  DM: Patient is supposed to be on metformin and Amaryl.  However she has not been taking either of them.  She never took the Amaryl.  Changed eating habits since [facility] Day of this yr.  She cut out sugars, bread, starch veggies and all sodas.  With this she has lost about 15 pounds.  Blood sugars started dropping so she decreased metformin from thousand milligrams daily to 500 mg daily.  Even with this decreased she still was having low blood sugars.  So she stopped taking the metformin altogether.  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  -Requesting work excuse for 1 day last month.  She was having some pain in the left knee at that time but it is subsequently better.  She finds that she has to stop for about 3 minutes to give her knee a break after doing a full patrol around the building.  On average she has to patrol the building about 4 times during her shift.   HTN: She stopped taking lisinopril due to dizziness.  Last took it about a week ago.  No device to check blood pressure.  She tries not to use too much salt in the foods.    HL:  Never took Lipitor.  Reports being prescribed Lipitor and niacin several years ago and after taking both together she experienced flushing of the face and numbness of the right arm.     Thyroid:  Not taking Leveothyroxine on regular bases Doses here thinning and fatigue.   HM: Due for Tdap and Pneumovax.  Patient wants to hold off on both until she gets insurance. Patient Active Problem List   Diagnosis Date Noted    . Bilateral chronic knee pain 08/05/2016  . Chronic left hip pain 08/05/2016  . HTN (hypertension) 08/18/2015  . Obesity (BMI 30-39.9) 09/05/2013  . ADD (attention deficit disorder) 09/05/2013  . Depression 09/05/2013  . Subclinical hypothyroidism   . DM type 2, uncontrolled, with neuropathy ([place])      Current Outpatient Medications on File Prior to Visit  Medication Sig Dispense Refill  . albuterol (PROVENTIL HFA;VENTOLIN HFA) 108 (90 Base) MCG/ACT inhaler Inhale 2 puffs into the lungs every 6 (six) hours as needed for wheezing or shortness of breath. 1 Inhaler 0  . fluticasone (FLONASE) 50 MCG/ACT nasal spray Place 2 sprays into both nostrils daily. 16 g 6  . levothyroxine (SYNTHROID, LEVOTHROID) 75 MCG tablet Take 1 tablet (75 mcg total) by mouth daily. 30 tablet 2  . lisinopril (PRINIVIL,ZESTRIL) 20 MG tablet Take 1 tablet (20 mg total) by mouth daily. 90 tablet 3  . metFORMIN (GLUCOPHAGE-XR) 500 MG 24 hr tablet Take 2 tablets (1,000 mg total) by mouth daily with breakfast. 60 tablet 5  . ACCU-CHEK FASTCLIX LANCETS MISC 1 each by Does not apply route 3 (three) times daily. (Patient not taking: Reported on 07/05/2017) 102 each 11  . atorvastatin (LIPITOR) 10 MG tablet Take 1 tablet (10 mg total) by mouth daily. (Patient not taking: Reported on 07/05/2017) 90 tablet 3  .  Blood Glucose Monitoring Suppl (TRUE METRIX METER) w/Device KIT 1 each by Does not apply route as needed. (Patient not taking: Reported on 07/05/2017) 1 kit 0  . cyclobenzaprine (FLEXERIL) 10 MG tablet Take 1 tablet (10 mg total) by mouth 3 (three) times daily as needed for muscle spasms. (Patient not taking: Reported on 07/05/2017) 30 tablet 0  . glimepiride (AMARYL) 2 MG tablet Take 2 mg in the morning before breakfast  for one week, then increase to 4 mg (Patient not taking: Reported on 07/05/2017) 60 tablet 3  . glucose blood (TRUE METRIX BLOOD GLUCOSE TEST) test strip Use as instructed (Patient not taking: Reported on 07/05/2017)  100 each 12  . naproxen (NAPROSYN) 500 MG tablet Take 1 tablet (500 mg total) by mouth 2 (two) times daily with a meal. (Patient not taking: Reported on 07/05/2017) 30 tablet 0  . ranitidine (ZANTAC) 150 MG tablet Take 1 tablet (150 mg total) by mouth 2 (two) times daily. (Patient not taking: Reported on 08/05/2016) 60 tablet 0   No current facility-administered medications on file prior to visit.     Allergies  Allergen Reactions  . Iodine Hives  . Macrobid [Nitrofurantoin Monohyd Macro] Nausea Only  . Glipizide Other (See Comments)    Shaky and lightheaded   . Sulfa Antibiotics Rash    Social History   Socioeconomic History  . Marital status: Married    Spouse name: Not on file  . Number of children: 2   . Years of education: some colle  . Highest education level: Not on file  Occupational History  . Occupation: Chief Financial Officer  . Financial resource strain: Not on file  . Food insecurity:    Worry: Not on file    Inability: Not on file  . Transportation needs:    Medical: Not on file    Non-medical: Not on file  Tobacco Use  . Smoking status: Former Research scientist (life sciences)  . Smokeless tobacco: Never Used  Substance and Sexual Activity  . Alcohol use: No  . Drug use: No  . Sexual activity: Yes    Birth control/protection: None  Lifestyle  . Physical activity:    Days per week: Not on file    Minutes per session: Not on file  . Stress: Not on file  Relationships  . Social connections:    Talks on phone: Not on file    Gets together: Not on file    Attends religious service: Not on file    Active member of club or organization: Not on file    Attends meetings of clubs or organizations: Not on file    Relationship status: Not on file  . Intimate partner violence:    Fear of current or ex partner: Not on file    Emotionally abused: Not on file    Physically abused: Not on file    Forced sexual activity: Not on file  Other Topics Concern  . Not on file  Social  History Narrative   Lives with husband and son (66).   One son in [place] ( 20)     Family History  Problem Relation Age of Onset  . Diabetes Mother   . Liver disease Father   . Cancer Brother        Hodgkin's Lymphoma   . Aneurysm Maternal Grandmother 59    Past Surgical History:  Procedure Laterality Date  . ENDOMETRIAL ABLATION  2006   . KNEE SURGERY  2012,  2004    both knees, meniscus surgery     ROS: Review of Systems Negative except as stated above PHYSICAL EXAM: BP 123/82   Pulse 71   Temp 98.3 F (36.8 C) (Oral)   Resp 16   Ht '5\' 9"'  (1.753 m)   Wt 223 lb 6.4 oz (101.3 kg)   SpO2 97%   BMI 32.99 kg/m   Wt Readings from Last 3 Encounters:  07/05/17 223 lb 6.4 oz (101.3 kg)  08/05/16 246 lb (111.6 kg)  05/27/16 244 lb 6.4 oz (110.9 kg)   Th Physical Exam  General appearance - alert, well appearing, age female and in no distress Mental status - normal mood, behavior, speech, dress, motor activity, and thought processes Mouth - mucous membranes moist, pharynx normal without lesions Neck - supple, no significant adenopathy Chest - clear to auscultation, no wheezes, rales or rhonchi, symmetric air entry Heart - normal rate, regular rhythm, normal S1, S2, no murmurs, rubs, clicks or gallops Musculoskeletal -she has mild to moderate bowlegs left knee greater than right.  She walks without assistive device.  Mild enlargement of the left knee joint.  Mild discomfort with passive movement of the left knee joint. Extremities -lower extremity edema. Diabetic Foot Exam - Simple   Simple Foot Form Visual Inspection See comments:  Yes Sensation Testing Intact to touch and monofilament testing bilaterally:  Yes Pulse Check Posterior Tibialis and Dorsalis pulse intact bilaterally:  Yes Comments Toenails are overgrown and slightly thick.  Noninflamed bunion of the right big toe.     Results for orders placed or performed in visit on 07/05/17  POCT  glucose (manual entry)  Result Value Ref Range   POC Glucose 118 (A) 70 - 99 mg/dl  POCT glycosylated hemoglobin (Hb A1C)  Result Value Ref Range   Hemoglobin A1C  4.0 - 5.6 %   HbA1c POC (<> result, manual entry)  4.0 - 5.6 %   HbA1c, POC (prediabetic range)  5.7 - 6.4 %   HbA1c, POC (controlled diabetic range) 8.1 (A) 0.0 - 7.0 %     ASSESSMENT AND PLAN: 1. Controlled type 2 diabetes mellitus without complication, without long-term current use of insulin (HCC) Blood sugars controlled based on reported blood sugar readings.  However A1c not at goal.  Advised patient to check blood sugars twice a day before breakfast and before dinner.  She will return in 2 weeks with these readings to see our clinical pharmacist.  If readings are not at goal between 90-1 30 we may consider restarting low-dose of metformin.  Continue regular exercise.  Commended her on healthy eating habits. - POCT glucose (manual entry) - POCT glycosylated hemoglobin (Hb A1C) - Microalbumin / creatinine urine ratio - Lipid panel - CBC - Comprehensive metabolic panel - Blood Glucose Monitoring Suppl (TRUE METRIX METER) w/Device KIT; 1 each by Does not apply route as needed.  Dispense: 1 kit; Refill: 0 - glucose blood (TRUE METRIX BLOOD GLUCOSE TEST) test strip; Use as instructed  Dispense: 100 each; Refill: 12 - TRUEPLUS LANCETS 28G MISC; Use as directed  Dispense: 100 each; Refill: 4  2. Hypothyroidism, unspecified type -Recommend restarting her thyroid medication.  We will check TSH today. - TSH  3. Essential hypertension Blood pressure looks okay off of lisinopril so we will hold off on that for now.  DASH diet encouraged  4. Acute pain of left knee Left knee is doing better.  Exam suggests osteoarthritis.  Weight loss that she has had  will help.  Work release given  5. Breast cancer screening - MM Digital Screening; Future  6. 23-polyvalent pneumococcal polysaccharide vaccine declined    Patient was  given the opportunity to ask questions.  Patient verbalized understanding of the plan and was able to repeat key elements of the plan.   Orders Placed This Encounter  Procedures  . Microalbumin / creatinine urine ratio  . POCT glucose (manual entry)  . POCT glycosylated hemoglobin (Hb A1C)     Requested Prescriptions    No prescriptions requested or ordered in this encounter    No follow-ups on file.  Karle Plumber, MD, FACP

## 2017-07-05 NOTE — Progress Notes (Signed)
Pt states she is needing a smaller dose of lisinopril  Pt states maybe she needs to come off the metformin or the dosage needs to be reduce

## 2017-07-05 NOTE — Patient Instructions (Signed)
Please give appointment with Franky MachoLuke in 2 weeks for diabetes check.  Please check blood sugars twice a day and bring in readings in 2 weeks.

## 2017-07-06 ENCOUNTER — Telehealth: Payer: Self-pay

## 2017-07-06 ENCOUNTER — Other Ambulatory Visit: Payer: Self-pay | Admitting: Internal Medicine

## 2017-07-06 DIAGNOSIS — E039 Hypothyroidism, unspecified: Secondary | ICD-10-CM

## 2017-07-06 DIAGNOSIS — E038 Other specified hypothyroidism: Secondary | ICD-10-CM

## 2017-07-06 LAB — COMPREHENSIVE METABOLIC PANEL
ALBUMIN: 4.7 g/dL (ref 3.5–5.5)
ALK PHOS: 77 IU/L (ref 39–117)
ALT: 27 IU/L (ref 0–32)
AST: 34 IU/L (ref 0–40)
Albumin/Globulin Ratio: 1.5 (ref 1.2–2.2)
BUN/Creatinine Ratio: 17 (ref 9–23)
BUN: 22 mg/dL (ref 6–24)
Bilirubin Total: 0.8 mg/dL (ref 0.0–1.2)
CO2: 22 mmol/L (ref 20–29)
Calcium: 10.1 mg/dL (ref 8.7–10.2)
Chloride: 105 mmol/L (ref 96–106)
Creatinine, Ser: 1.3 mg/dL — ABNORMAL HIGH (ref 0.57–1.00)
GFR calc Af Amer: 53 mL/min/{1.73_m2} — ABNORMAL LOW (ref >59)
GFR calc non Af Amer: 46 mL/min/{1.73_m2} — ABNORMAL LOW (ref >59)
Globulin, Total: 3.1 g/dL (ref 1.5–4.5)
Glucose: 103 mg/dL — ABNORMAL HIGH (ref 65–99)
Potassium: 4.2 mmol/L (ref 3.5–5.2)
SODIUM: 143 mmol/L (ref 134–144)
Total Protein: 7.8 g/dL (ref 6.0–8.5)

## 2017-07-06 LAB — CBC
HEMOGLOBIN: 14.4 g/dL (ref 11.1–15.9)
Hematocrit: 42.4 % (ref 34.0–46.6)
MCH: 28.1 pg (ref 26.6–33.0)
MCHC: 34 g/dL (ref 31.5–35.7)
MCV: 83 fL (ref 79–97)
Platelets: 177 10*3/uL (ref 150–450)
RBC: 5.12 x10E6/uL (ref 3.77–5.28)
RDW: 14.6 % (ref 12.3–15.4)
WBC: 8.1 10*3/uL (ref 3.4–10.8)

## 2017-07-06 LAB — MICROALBUMIN / CREATININE URINE RATIO
CREATININE, UR: 203.4 mg/dL
MICROALBUM., U, RANDOM: 18.5 ug/mL
Microalb/Creat Ratio: 9.1 mg/g creat (ref 0.0–30.0)

## 2017-07-06 LAB — TSH: TSH: 6.24 u[IU]/mL — ABNORMAL HIGH (ref 0.450–4.500)

## 2017-07-06 LAB — LIPID PANEL
Chol/HDL Ratio: 3.3 ratio (ref 0.0–4.4)
Cholesterol, Total: 234 mg/dL — ABNORMAL HIGH (ref 100–199)
HDL: 71 mg/dL (ref >39)
LDL Calculated: 146 mg/dL — ABNORMAL HIGH (ref 0–99)
Triglycerides: 83 mg/dL (ref 0–149)
VLDL Cholesterol Cal: 17 mg/dL (ref 5–40)

## 2017-07-06 MED ORDER — LEVOTHYROXINE SODIUM 25 MCG PO TABS: 25.0000 ug | ORAL_TABLET | Freq: Every day | ORAL | 4 refills | Status: DC

## 2017-07-06 MED ORDER — PRAVASTATIN SODIUM 20 MG PO TABS: 20.0000 mg | ORAL_TABLET | Freq: Every day | ORAL | 3 refills | Status: DC

## 2017-07-06 MED FILL — PRAVASTATIN SODIUM 20 MG TA: 20 | 30 days supply | Qty: 30 | Fill #0

## 2017-07-06 MED FILL — LEVOTHYROXINE 25 MCG TABLET: 25 | 30 days supply | Qty: 30 | Fill #0

## 2017-07-06 NOTE — Telephone Encounter (Signed)
Contacted pt to go over lab results pt is aware and doesn't have any questions or concerns 

## 2017-07-13 ENCOUNTER — Encounter: Payer: Self-pay | Admitting: Family Medicine

## 2017-07-13 ENCOUNTER — Ambulatory Visit: Payer: Self-pay | Attending: Family Medicine | Admitting: Family Medicine

## 2017-07-13 VITALS — BP 130/76 | HR 82 | Temp 97.9°F | Ht 69.0 in | Wt 225.6 lb

## 2017-07-13 DIAGNOSIS — Z79899 Other long term (current) drug therapy: Secondary | ICD-10-CM | POA: Insufficient documentation

## 2017-07-13 DIAGNOSIS — J45909 Unspecified asthma, uncomplicated: Secondary | ICD-10-CM | POA: Insufficient documentation

## 2017-07-13 DIAGNOSIS — E119 Type 2 diabetes mellitus without complications: Secondary | ICD-10-CM | POA: Insufficient documentation

## 2017-07-13 DIAGNOSIS — Z79890 Hormone replacement therapy (postmenopausal): Secondary | ICD-10-CM | POA: Insufficient documentation

## 2017-07-13 DIAGNOSIS — Z888 Allergy status to other drugs, medicaments and biological substances status: Secondary | ICD-10-CM | POA: Insufficient documentation

## 2017-07-13 DIAGNOSIS — M25562 Pain in left knee: Secondary | ICD-10-CM | POA: Insufficient documentation

## 2017-07-13 DIAGNOSIS — Z881 Allergy status to other antibiotic agents status: Secondary | ICD-10-CM | POA: Insufficient documentation

## 2017-07-13 DIAGNOSIS — M79605 Pain in left leg: Secondary | ICD-10-CM | POA: Insufficient documentation

## 2017-07-13 DIAGNOSIS — Z9889 Other specified postprocedural states: Secondary | ICD-10-CM | POA: Insufficient documentation

## 2017-07-13 DIAGNOSIS — Z7952 Long term (current) use of systemic steroids: Secondary | ICD-10-CM | POA: Insufficient documentation

## 2017-07-13 DIAGNOSIS — I10 Essential (primary) hypertension: Secondary | ICD-10-CM | POA: Insufficient documentation

## 2017-07-13 DIAGNOSIS — N289 Disorder of kidney and ureter, unspecified: Secondary | ICD-10-CM | POA: Insufficient documentation

## 2017-07-13 DIAGNOSIS — E039 Hypothyroidism, unspecified: Secondary | ICD-10-CM | POA: Insufficient documentation

## 2017-07-13 DIAGNOSIS — Z882 Allergy status to sulfonamides status: Secondary | ICD-10-CM | POA: Insufficient documentation

## 2017-07-13 MED ORDER — PREDNISONE 20 MG PO TABS: 20.0000 mg | ORAL_TABLET | Freq: Every day | ORAL | 0 refills | Status: DC

## 2017-07-13 MED ORDER — FLUTICASONE PROPIONATE 50 MCG/ACT NA SUSP: 2.0000 | Freq: Every day | NASAL | 6 refills | Status: DC

## 2017-07-13 MED FILL — FLUTICASONE PROP 50 MCG SPR: 50 | 30 days supply | Qty: 16 | Fill #0

## 2017-07-13 MED FILL — predniSONE 20 MG TABS: 20 | 5 days supply | Qty: 5 | Fill #0

## 2017-07-13 NOTE — Progress Notes (Signed)
Subjective:  Patient ID: Tanya Ball, female    DOB: 08-25-1960  Age: 57 y.o. MRN: [id number]  CC: Leg Pain   HPI Tanya Ball is a 57 year old female with a history of type 2 diabetes mellitus (A1c 8.1), hypothyroidism who presents today for an acute visit complaining of left leg pain which started in the lateral aspect of her left leg and radiated down her left lower extremity to her ankle.  She was seen by her PCP 1 week ago at which time she had complained of left knee pain and informs me today she has no knee pain but left leg pain which became unbearable earlier this morning to the extent that she could not complete her patrol and also lost strength in her leg but pain has improved and is mild at this time. 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 She denies numbness, swelling in her lower extremities and does have pravastatin on her med list but informs me she never commenced it.  Past Medical History:  Diagnosis Date  . ADD (attention deficit disorder) 2002  . Arthritis 2006   knees, fingers   . Asthma 2004  . Diabetes mellitus without complication ([place]) 8315  . Hypertension 2009  . Hypothyroidism 2008    previously on levothyroxine   . Sciatica     Past Surgical History:  Procedure Laterality Date  . ENDOMETRIAL ABLATION  2006   . KNEE SURGERY  2012, 2004    both knees, meniscus surgery     Allergies  Allergen Reactions  . Iodine Hives  . Macrobid [Nitrofurantoin Monohyd Macro] Nausea Only  . Glipizide Other (See Comments)    Shaky and lightheaded   . Sulfa Antibiotics Rash     Outpatient Medications Prior to Visit  Medication Sig Dispense Refill  . levothyroxine (SYNTHROID, LEVOTHROID) 25 MCG tablet Take 1 tablet (25 mcg total) by mouth daily. 30 tablet 4  . albuterol (PROVENTIL HFA;VENTOLIN HFA) 108 (90 Base) MCG/ACT inhaler Inhale 2 puffs  into the lungs every 6 (six) hours as needed for wheezing or shortness of breath. (Patient not taking: Reported on 07/13/2017) 1 Inhaler 0  . Blood Glucose Monitoring Suppl (TRUE METRIX METER) w/Device KIT 1 each by Does not apply route as needed. (Patient not taking: Reported on 07/13/2017) 1 kit 0  . fluticasone (FLONASE) 50 MCG/ACT nasal spray Place 2 sprays into both nostrils daily. (Patient not taking: Reported on 07/13/2017) 16 g 6  . glucose blood (TRUE METRIX BLOOD GLUCOSE TEST) test strip Use as instructed (Patient not taking: Reported on 07/13/2017) 100 each 12  . pravastatin (PRAVACHOL) 20 MG tablet Take 1 tablet (20 mg total) by mouth daily. (Patient not taking: Reported on 07/13/2017) 90 tablet 3  . TRUEPLUS LANCETS 28G MISC Use as directed (Patient not taking: Reported on 07/13/2017) 100 each 4   No facility-administered medications prior to visit.     ROS Review of Systems  Constitutional: Negative for activity change, appetite change and fatigue.  HENT: Negative for congestion, sinus pressure and sore throat.   Eyes: Negative for visual disturbance.  Respiratory: Negative for cough, chest tightness, shortness of breath and wheezing.   Cardiovascular: Negative for chest pain and palpitations.  Gastrointestinal: Negative for abdominal distention, abdominal pain and constipation.  Endocrine: Negative for polydipsia.  Genitourinary: Negative for dysuria and frequency.  Musculoskeletal:       See  hpi  Skin: Negative for rash.  Neurological: Negative for tremors, light-headedness and numbness.  Hematological: Does not bruise/bleed easily.  Psychiatric/Behavioral: Negative for agitation and behavioral problems.    Objective:  BP 130/76   Pulse 82   Temp 97.9 F (36.6 C) (Oral)   Ht '5\' 9"'  (1.753 m)   Wt 225 lb 9.6 oz (102.3 kg)   SpO2 98%   BMI 33.32 kg/m   BP/Weight 07/13/2017 05/06/3172 0/09/9276  Systolic BP 004 471 580  Diastolic BP 76 82 89  Wt. (Lbs) 225.6 223.4 246    BMI 33.32 32.99 36.33      Physical Exam  Constitutional: She is oriented to person, place, and time. She appears well-developed and well-nourished.  Cardiovascular: Normal rate, normal heart sounds and intact distal pulses.  No murmur heard. Pulmonary/Chest: Effort normal and breath sounds normal. She has no wheezes. She has no rales. She exhibits no tenderness.  Abdominal: Soft. Bowel sounds are normal. She exhibits no distension and no mass. There is no tenderness.  Musculoskeletal: Normal range of motion. She exhibits tenderness (slight L posterolateral knee TTP; negative Homan's sign). She exhibits no edema.  Neurological: She is alert and oriented to person, place, and time.      Assessment & Plan:   1. Left leg pain Examination findings are negative for DVT Pain is minimal at this time ?  Tendinopathy Holding off on NSAIDs due to slightly impaired renal function - predniSONE (DELTASONE) 20 MG tablet; Take 1 tablet (20 mg total) by mouth daily with breakfast.  Dispense: 5 tablet; Refill: 0   Meds ordered this encounter  Medications  . predniSONE (DELTASONE) 20 MG tablet    Sig: Take 1 tablet (20 mg total) by mouth daily with breakfast.    Dispense:  5 tablet    Refill:  0    Follow-up: Return if symptoms worsen or fail to improve, for For follow-up of chronic medical conditions, keep previously scheduled appointment with PCP.   Charlott Rakes MD

## 2017-07-13 NOTE — Patient Instructions (Signed)
Leg Cramps Leg cramps occur when a muscle or muscles tighten and you have no control over this tightening (involuntary muscle contraction). Muscle cramps can develop in any muscle, but the most common place is in the calf muscles of the leg. Those cramps can occur during exercise or when you are at rest. Leg cramps are painful, and they may last for a few seconds to a few minutes. Cramps may return several times before they finally stop. Usually, leg cramps are not caused by a serious medical problem. In many cases, the cause is not known. Some common causes include:  Overexertion.  Overuse from repetitive motions, or doing the same thing over and over.  Remaining in a certain position for a long period of time.  Improper preparation, form, or technique while performing a sport or an activity.  Dehydration.  Injury.  Side effects of some medicines.  Abnormally low levels of the salts and ions in your blood (electrolytes), especially potassium and calcium. These levels could be low if you are taking water pills (diuretics) or if you are pregnant.  Follow these instructions at home: Watch your condition for any changes. Taking the following actions may help to lessen any discomfort that you are feeling:  Stay well-hydrated. Drink enough fluid to keep your urine clear or pale yellow.  Try massaging, stretching, and relaxing the affected muscle. Do this for several minutes at a time.  For tight or tense muscles, use a warm towel, heating pad, or hot shower water directed to the affected area.  If you are sore or have pain after a cramp, applying ice to the affected area may relieve discomfort. ? Put ice in a plastic bag. ? Place a towel between your skin and the bag. ? Leave the ice on for 20 minutes, 2-3 times per day.  Avoid strenuous exercise for several days if you have been having frequent leg cramps.  Make sure that your diet includes the essential minerals for your muscles to  work normally.  Take medicines only as directed by your health care provider.  Contact a health care provider if:  Your leg cramps get more severe or more frequent, or they do not improve over time.  Your foot becomes cold, numb, or blue. This information is not intended to replace advice given to you by your health care provider. Make sure you discuss any questions you have with your health care provider. Document Released: 01/29/2004 Document Revised: 05/29/2015 Document Reviewed: 11/28/2013 Elsevier Interactive Patient Education  2018 Elsevier Inc.  

## 2017-07-19 ENCOUNTER — Ambulatory Visit: Payer: Self-pay | Admitting: Pharmacist

## 2017-07-21 ENCOUNTER — Ambulatory Visit: Payer: Self-pay | Attending: Internal Medicine | Admitting: Physician Assistant

## 2017-07-21 ENCOUNTER — Ambulatory Visit (HOSPITAL_BASED_OUTPATIENT_CLINIC_OR_DEPARTMENT_OTHER): Payer: Self-pay | Admitting: Pharmacist

## 2017-07-21 VITALS — BP 102/66 | HR 77 | Temp 98.2°F | Resp 18 | Ht 69.0 in | Wt 224.0 lb

## 2017-07-21 DIAGNOSIS — Z79899 Other long term (current) drug therapy: Secondary | ICD-10-CM | POA: Insufficient documentation

## 2017-07-21 DIAGNOSIS — E039 Hypothyroidism, unspecified: Secondary | ICD-10-CM | POA: Insufficient documentation

## 2017-07-21 DIAGNOSIS — E1165 Type 2 diabetes mellitus with hyperglycemia: Secondary | ICD-10-CM

## 2017-07-21 DIAGNOSIS — J45909 Unspecified asthma, uncomplicated: Secondary | ICD-10-CM | POA: Insufficient documentation

## 2017-07-21 DIAGNOSIS — M79605 Pain in left leg: Secondary | ICD-10-CM | POA: Insufficient documentation

## 2017-07-21 DIAGNOSIS — R531 Weakness: Secondary | ICD-10-CM | POA: Insufficient documentation

## 2017-07-21 DIAGNOSIS — E1140 Type 2 diabetes mellitus with diabetic neuropathy, unspecified: Secondary | ICD-10-CM

## 2017-07-21 DIAGNOSIS — IMO0002 Reserved for concepts with insufficient information to code with codable children: Secondary | ICD-10-CM

## 2017-07-21 DIAGNOSIS — R29898 Other symptoms and signs involving the musculoskeletal system: Secondary | ICD-10-CM

## 2017-07-21 DIAGNOSIS — R7309 Other abnormal glucose: Secondary | ICD-10-CM | POA: Insufficient documentation

## 2017-07-21 DIAGNOSIS — I10 Essential (primary) hypertension: Secondary | ICD-10-CM | POA: Insufficient documentation

## 2017-07-21 LAB — GLUCOSE, POCT (MANUAL RESULT ENTRY): POC Glucose: 106 mg/dl — AB (ref 70–99)

## 2017-07-21 MED ORDER — TRAMADOL HCL 50 MG PO TABS
50.0000 mg | ORAL_TABLET | Freq: Three times a day (TID) | ORAL | 0 refills | Status: DC | PRN
Start: 2017-07-21 — End: 2018-12-14

## 2017-07-21 NOTE — Progress Notes (Signed)
    S:    No chief complaint on file.  Patient arrives in good spirits after office visit with Levada Dy. Levada Dy saw her as a walk-in for leg pain today. She presents to [place] for diabetes management at the request of Dr. Wynetta Emery. Patient was referred to me on 07/05/17. Patient was last seen by Primary Care Provider on 07/05/17.    PMH:  - HTN, DM, obesity (BMI 33)  HPI pertinent to DM:   PCP visit 07/05/17:  - Pt was supposed to be taking metformin. Pt stated at that visit that she stopped metformin. Of note, patient gave home sugar range as 80 - 103. Dr. Wynetta Emery discontinued metformin.  - A1c resulted as 8.1  - POCT glucose 118 - Advised to check home fasting and pre-dinner readings and report these to me.   Of note, patient saw Dr. Margarita Rana 07/13/17 for leg pain. Pred taper given. Patient started prednisone taper 07/13/17 and reports finishing 07/17/17.  Insurance coverage/medication affordability: self-pay  Patient reports adherence with levothyroxine. States she is taking no other medications.  Patient denies hypoglycemic events.  Patient reported dietary habits: Patient states she used to have high intake of sodas, jelly donuts, sugary foods in general. States that she is considering multiple diets such as fasting and the keto diet. She states she knows the right things to eat.    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 Physical activity is limited due to leg pain.   Patient denies nocturia.  Patient denies any changes in baseline neuropathy. Patient denies visual changes.  O:   Lab Results  Component Value Date   HGBA1C 8.1 (A) 07/05/2017   There were no vitals filed for this visit.  Lipid Panel     Component Value Date/Time   CHOL 234 (H) 07/05/2017 1152   TRIG 83 07/05/2017 1152   HDL 71 07/05/2017 1152   CHOLHDL 3.3 07/05/2017 1152   CHOLHDL 4.0 09/05/2013 1210   VLDL 27  09/05/2013 1210   LDLCALC 146 (H) 07/05/2017 1152   POC at Angela's visit was 106. She brings in home BG log. Home fasting CBG: 76-118  Of note, patient took prednisone taper from 7/10 to 7/14. Fasting glucose levels increased and ranged from 122 to 197. Upon completion, glucose levels returned to goal.  A/P: Diabetes longstanding currently controlled. Patient does not currently take any medication for diabetes. Fasting glucose levels consistently less than 130. Of note, prednisone did cause some dysregulation of glycemic control but levels returned to goal after discontinuation. POC value at goal. Dr. Wynetta Emery recommended to resume metformin at low-dose should home levels reflect uncontrolled diabetes. Will not initiate metformin at this time and wait for A1c result 09/2017.  -Metformin not restarted at this time. Should patient require metformin restart in the future, would recommend max daily dose 1000 mg due to eGFR of 46 collected 07/05/17   -Reinforced importance of lifestyle changes. -Next A1C anticipated 09/2017.    Written patient instructions provided. Total time in face to face counseling 15 minutes.   Follow up PCP Clinic Visit in 09/09/17.    Ricardo Jericho, PharmD Candidate [employer] of 2020  Benard Halsted, PharmD, [place] [phone]

## 2017-07-21 NOTE — Progress Notes (Signed)
Patient ID: Tanya Ball, female   DOB: 05-15-1960, 57 y.o.   MRN: [id number]   Mildred Tuccillo, is a 57 y.o. female  LGX:[id number]  CXK:[id number]  DOB - [birth year]  Subjective:  Chief Complaint and HPI: Tanya Ball is a 57 y.o. female here today with continued L leg pain and now developing weakness and a limp over the last few weeks.  She has had the lower leg pain intermittently over the last few years but it has worsened over the last month and she started developing weakness.  Her lower back also hurts. See last note-recent course of prednisone helped but pain returned immediately after finishing prednisone. She can't take nsaids due to impaired renal function.    She was also supposed to meet with the clinical pharmacist today as a f/up on blood sugars, but we will do that within the [facility].  She started drastically changing her diet in May and is eating little to no carbs.  She has lost about 20 pounds.  Her blood sugars have been 80-139 not on any diabetes meds and after finishing prednisone.  While she was on prednisone, her blood sugars went as high as 200.     ROS:   Constitutional:  No f/c, No night sweats, No unexplained weight loss. EENT:  No vision changes, No blurry vision, No hearing changes. No mouth, throat, or ear problems.  Respiratory: No cough, No SOB Cardiac: No CP, no palpitations GI:  No abd pain, No N/V/D. GU: No Urinary s/sx Musculoskeletal: LBP, L leg pain and weakness Neuro: No headache, no dizziness, no motor weakness.  Skin: No rash Endocrine:  No polydipsia. No polyuria.  Psych: Denies SI/HI  No problems updated.  ALLERGIES: Allergies  Allergen Reactions  . Iodine Hives  . Macrobid [Nitrofurantoin Monohyd Macro] Nausea Only  . Glipizide Other (See Comments)    Shaky and lightheaded   . Sulfa Antibiotics Rash    PAST MEDICAL HISTORY: Past Medical History:  Diagnosis Date  . ADD (attention deficit disorder) 2002  . Arthritis 2006     knees, fingers   . Asthma 2004  . Diabetes mellitus without complication ([place]) 9702  . Hypertension 2009  . Hypothyroidism 2008    previously on levothyroxine   . Sciatica     MEDICATIONS AT HOME: Prior to Admission medications   Medication Sig Start Date End Date Taking? Authorizing Provider  Blood Glucose Monitoring Suppl (TRUE METRIX METER) w/Device KIT 1 each by Does not apply route as needed. 07/05/17  Yes Ladell Pier, MD  fluticasone (FLONASE) 50 MCG/ACT nasal spray Place 2 sprays into both nostrils daily. 07/13/17  Yes Ladell Pier, MD  glucose blood (TRUE METRIX BLOOD GLUCOSE TEST) test strip Use as instructed 07/05/17  Yes Ladell Pier, MD  levothyroxine (SYNTHROID, LEVOTHROID) 25 MCG tablet Take 1 tablet (25 mcg total) by mouth daily. 07/06/17  Yes Ladell Pier, MD  pravastatin (PRAVACHOL) 20 MG tablet Take 1 tablet (20 mg total) by mouth daily. 07/06/17  Yes Ladell Pier, MD  TRUEPLUS LANCETS 28G MISC Use as directed 07/05/17  Yes Ladell Pier, MD  albuterol (PROVENTIL HFA;VENTOLIN HFA) 108 (90 Base) MCG/ACT inhaler Inhale 2 puffs into the lungs every 6 (six) hours as needed for wheezing or shortness of breath. Patient not taking: Reported on 07/13/2017 08/01/15   Boykin Nearing, MD  traMADol (ULTRAM) 50 MG tablet Take 1 tablet (50 mg total) by mouth every 8 (eight) hours as needed. 07/21/17  Argentina Donovan, PA-C     Objective:  EXAM:   Vitals:   07/21/17 1133  BP: 102/66  Pulse: 77  Resp: 18  Temp: 98.2 F (36.8 C)  TempSrc: Oral  SpO2: 98%  Weight: 224 lb (101.6 kg)  Height: '5\' 9"'  (1.753 m)    General appearance : A&OX3. NAD. Non-toxic-appearing, walks with a limp favoring R HEENT: Atraumatic and Normocephalic.  PERRLA. EOM intact.   Neck: supple, no JVD. No cervical lymphadenopathy. No thyromegaly Chest/Lungs:  Breathing-non-labored, Good air entry bilaterally, breath sounds normal without rales, rhonchi, or wheezing  CVS: S1  S2 regular, no murmurs, gallops, rubs  No visible abnormality of the L leg.  Symmetry w/o atrophy of either leg.  No Homan's or calf erythema.  DTR=BLE.  Neg SLR B.  There is paraspinus spasm in the lower back. Extremities: Bilateral Lower Ext shows no edema, both legs are warm to touch with = pulse throughout Neurology:  CN II-XII grossly intact, Non focal.   Psych:  TP linear. J/I WNL. Normal speech. Appropriate eye contact and affect.  Skin:  No Rash  Data Review Lab Results  Component Value Date   HGBA1C 8.1 (A) 07/05/2017   HGBA1C 9.3 08/05/2016   HGBA1C 9.8 05/27/2016     Assessment & Plan   1. Weakness of left lower extremity - MR Lumbar Spine Wo Contrast; Future - traMADol (ULTRAM) 50 MG tablet; Take 1 tablet (50 mg total) by mouth every 8 (eight) hours as needed.  Dispense: 30 tablet; Refill: 0  2. DM type 2, uncontrolled, with neuropathy ([place]) She has radically changes her diet, lost 20 pounds and is currently not on meds.  Blood sugars running 80-130s.  Continue to monitor fasting cbg daily and A1C every 2-3 months.  If her blood sugars are consistently under 110 or so, she would not need meds.  Follow closely.   - Glucose (CBG)  3. Pain of left lower extremity with concern for radiculopathy due to new onset weakness - traMADol (ULTRAM) 50 MG tablet; Take 1 tablet (50 mg total) by mouth every 8 (eight) hours as needed.  Dispense: 30 tablet; Refill: 0   Patient have been counseled extensively about nutrition and exercise  Return for keep f/up 9/6 with Dr Wynetta Emery.  The patient was given clear instructions to go to ER or return to medical center if symptoms don't improve, worsen or new problems develop. The patient verbalized understanding. The patient was told to call to get lab results if they haven't heard anything in the next week.     Freeman Caldron, PA-C [facility], [place]   07/21/2017, 11:48 AM

## 2017-07-28 ENCOUNTER — Ambulatory Visit (HOSPITAL_COMMUNITY)
Admission: RE | Admit: 2017-07-28 | Discharge: 2017-07-28 | Disposition: A | Discharge status: Home / Self Care | Payer: Self-pay | Source: Ambulatory Visit | Attending: Physician Assistant | Admitting: Physician Assistant

## 2017-07-28 DIAGNOSIS — N261 Atrophy of kidney (terminal): Secondary | ICD-10-CM | POA: Insufficient documentation

## 2017-07-28 DIAGNOSIS — M48061 Spinal stenosis, lumbar region without neurogenic claudication: Secondary | ICD-10-CM | POA: Insufficient documentation

## 2017-07-28 DIAGNOSIS — R29898 Other symptoms and signs involving the musculoskeletal system: Secondary | ICD-10-CM

## 2017-07-28 DIAGNOSIS — M47816 Spondylosis without myelopathy or radiculopathy, lumbar region: Secondary | ICD-10-CM | POA: Insufficient documentation

## 2017-08-02 ENCOUNTER — Telehealth: Payer: Self-pay | Admitting: *Deleted

## 2017-08-02 ENCOUNTER — Other Ambulatory Visit: Payer: Self-pay | Admitting: Physician Assistant

## 2017-08-02 DIAGNOSIS — M5410 Radiculopathy, site unspecified: Secondary | ICD-10-CM

## 2017-08-02 DIAGNOSIS — R9389 Abnormal findings on diagnostic imaging of other specified body structures: Secondary | ICD-10-CM

## 2017-08-02 NOTE — Telephone Encounter (Signed)
MA unable to reach patient due to ringing and no VM option. !!!Please inform patient of MRI showing arthritis and some small tears in the spine. Patient has been referred to a neurosurgeon to determine the best method for treatment. Patient needs to complete financial packet and obtain and appointment prior to referral being completed!!!

## 2017-08-02 NOTE — Telephone Encounter (Signed)
-----   Message from Anders SimmondsAngela M McClung, [place]PA-C sent at 08/02/2017  8:09 AM EDT ----- MRI shows arthritis and narrowing along with some fissures(small tears) in the spine.  I am referring you to a neurosurgeon to see what the best treatment options are going to be.  Thanks, Georgian CoAngela McClung, PA-C

## 2017-08-25 MED FILL — traMADol HCL 50 MG TABS: 50 | 10 days supply | Qty: 30 | Fill #0

## 2017-09-09 ENCOUNTER — Encounter: Payer: Self-pay | Admitting: Internal Medicine

## 2017-09-09 ENCOUNTER — Ambulatory Visit: Payer: Self-pay | Attending: Internal Medicine | Admitting: Internal Medicine

## 2017-09-09 VITALS — BP 135/85 | HR 67 | Temp 98.4°F | Resp 16 | Wt 215.8 lb

## 2017-09-09 DIAGNOSIS — R531 Weakness: Secondary | ICD-10-CM | POA: Insufficient documentation

## 2017-09-09 DIAGNOSIS — M5442 Lumbago with sciatica, left side: Secondary | ICD-10-CM | POA: Insufficient documentation

## 2017-09-09 DIAGNOSIS — IMO0002 Reserved for concepts with insufficient information to code with codable children: Secondary | ICD-10-CM

## 2017-09-09 DIAGNOSIS — E039 Hypothyroidism, unspecified: Secondary | ICD-10-CM | POA: Insufficient documentation

## 2017-09-09 DIAGNOSIS — E1165 Type 2 diabetes mellitus with hyperglycemia: Secondary | ICD-10-CM | POA: Insufficient documentation

## 2017-09-09 DIAGNOSIS — G8929 Other chronic pain: Secondary | ICD-10-CM

## 2017-09-09 DIAGNOSIS — E669 Obesity, unspecified: Secondary | ICD-10-CM | POA: Insufficient documentation

## 2017-09-09 DIAGNOSIS — M25562 Pain in left knee: Secondary | ICD-10-CM | POA: Insufficient documentation

## 2017-09-09 DIAGNOSIS — M25561 Pain in right knee: Secondary | ICD-10-CM | POA: Insufficient documentation

## 2017-09-09 DIAGNOSIS — Z881 Allergy status to other antibiotic agents status: Secondary | ICD-10-CM | POA: Insufficient documentation

## 2017-09-09 DIAGNOSIS — Z888 Allergy status to other drugs, medicaments and biological substances status: Secondary | ICD-10-CM | POA: Insufficient documentation

## 2017-09-09 DIAGNOSIS — M48061 Spinal stenosis, lumbar region without neurogenic claudication: Secondary | ICD-10-CM | POA: Insufficient documentation

## 2017-09-09 DIAGNOSIS — I10 Essential (primary) hypertension: Secondary | ICD-10-CM | POA: Insufficient documentation

## 2017-09-09 DIAGNOSIS — Z6830 Body mass index (BMI) 30.0-30.9, adult: Secondary | ICD-10-CM | POA: Insufficient documentation

## 2017-09-09 DIAGNOSIS — E1140 Type 2 diabetes mellitus with diabetic neuropathy, unspecified: Secondary | ICD-10-CM | POA: Insufficient documentation

## 2017-09-09 DIAGNOSIS — Z882 Allergy status to sulfonamides status: Secondary | ICD-10-CM | POA: Insufficient documentation

## 2017-09-09 DIAGNOSIS — Z79899 Other long term (current) drug therapy: Secondary | ICD-10-CM | POA: Insufficient documentation

## 2017-09-09 DIAGNOSIS — M170 Bilateral primary osteoarthritis of knee: Secondary | ICD-10-CM | POA: Insufficient documentation

## 2017-09-09 DIAGNOSIS — Z87891 Personal history of nicotine dependence: Secondary | ICD-10-CM | POA: Insufficient documentation

## 2017-09-09 DIAGNOSIS — F329 Major depressive disorder, single episode, unspecified: Secondary | ICD-10-CM | POA: Insufficient documentation

## 2017-09-09 LAB — GLUCOSE, POCT (MANUAL RESULT ENTRY): POC GLUCOSE: 97 mg/dL (ref 70–99)

## 2017-09-09 NOTE — Progress Notes (Signed)
Patient ID: Tanya Ball, female    DOB: Aug 01, 1960  MRN: [id number]  CC: Diabetes   Subjective: Tanya Ball is a 57 y.o. female who presents for chronic ds management. Her concerns today include:  HTN, DM, hypothyroid, HL, BL knee pain  Since last visit patient has been seen by our physician assistant with pain and weakness in the left leg.  She had an MRI done of the lumbar spine that revealed degenerative changes and moderate canal stenosis at L4-5.  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  She has to do at least 3 patrols.  By the time she gets to her third patrol the left leg hurts so bad and feels so weak she is barely able to make it.   -She has had to be off work a few times because of this.  Went to [facility] 08/18/2017 from work due to pain in the left leg and knee.  Had x-rays done that revealed significant arthritis in the left knee.  She also had x-rays of both ankles but I am unable to pull up in those reports.  Given steroid injection in buttock -Her boss has not allowed her to come back to work since 8/15.  She feels they do not believe her that she has an issue going on with the leg.  There was supposed to send some forms for the doctor to complete but we have not received those as yet.   -She is on the fence about whether she should go back to work.  Since  she has not been walking much, the knee and leg have not been bothering her as much. -The knee aches with prolonged sitting and walking. She also has pain in the lower back midline that is constant but bearable.  The pain radiates from the left side of her back to the side of the left leg. No numbness in legs.   She takes tramadol as needed and also has a muscle relaxant that she takes as needed  Patient Active Problem List   Diagnosis Date Noted  . Bilateral chronic knee pain 08/05/2016  . Chronic left hip pain 08/05/2016  . HTN (hypertension)  08/18/2015  . Obesity (BMI 30-39.9) 09/05/2013  . ADD (attention deficit disorder) 09/05/2013  . Depression 09/05/2013  . Subclinical hypothyroidism   . DM type 2, uncontrolled, with neuropathy ([place])      Current Outpatient Medications on File Prior to Visit  Medication Sig Dispense Refill  . albuterol (PROVENTIL HFA;VENTOLIN HFA) 108 (90 Base) MCG/ACT inhaler Inhale 2 puffs into the lungs every 6 (six) hours as needed for wheezing or shortness of breath. (Patient not taking: Reported on 07/13/2017) 1 Inhaler 0  . Blood Glucose Monitoring Suppl (TRUE METRIX METER) w/Device KIT 1 each by Does not apply route as needed. 1 kit 0  . fluticasone (FLONASE) 50 MCG/ACT nasal spray Place 2 sprays into both nostrils daily. 16 g 6  . glucose blood (TRUE METRIX BLOOD GLUCOSE TEST) test strip Use as instructed 100 each 12  . levothyroxine (SYNTHROID, LEVOTHROID) 25 MCG tablet Take 1 tablet (25 mcg total) by mouth daily. 30 tablet 4  . pravastatin (PRAVACHOL) 20 MG tablet Take 1 tablet (20 mg total) by mouth daily. 90 tablet 3  . traMADol (ULTRAM) 50 MG tablet Take 1 tablet (50 mg total) by mouth every 8 (eight) hours as needed. 30 tablet 0  . TRUEPLUS LANCETS 28G  MISC Use as directed 100 each 4   No current facility-administered medications on file prior to visit.     Allergies  Allergen Reactions  . Iodine Hives  . Macrobid [Nitrofurantoin Monohyd Macro] Nausea Only  . Glipizide Other (See Comments)    Shaky and lightheaded   . Sulfa Antibiotics Rash    Social History   Socioeconomic History  . Marital status: Married    Spouse name: Not on file  . Number of children: 2   . Years of education: some colle  . Highest education level: Not on file  Occupational History  . Occupation: Chief Financial Officer  . Financial resource strain: Not on file  . Food insecurity:    Worry: Not on file    Inability: Not on file  . Transportation needs:    Medical: Not on file    Non-medical:  Not on file  Tobacco Use  . Smoking status: Former Research scientist (life sciences)  . Smokeless tobacco: Never Used  Substance and Sexual Activity  . Alcohol use: No  . Drug use: No  . Sexual activity: Yes    Birth control/protection: None  Lifestyle  . Physical activity:    Days per week: Not on file    Minutes per session: Not on file  . Stress: Not on file  Relationships  . Social connections:    Talks on phone: Not on file    Gets together: Not on file    Attends religious service: Not on file    Active member of club or organization: Not on file    Attends meetings of clubs or organizations: Not on file    Relationship status: Not on file  . Intimate partner violence:    Fear of current or ex partner: Not on file    Emotionally abused: Not on file    Physically abused: Not on file    Forced sexual activity: Not on file  Other Topics Concern  . Not on file  Social History Narrative   Lives with husband and son (33).   One son in [place] ( 20)     Family History  Problem Relation Age of Onset  . Diabetes Mother   . Liver disease Father   . Cancer Brother        Hodgkin's Lymphoma   . Aneurysm Maternal Grandmother 46    Past Surgical History:  Procedure Laterality Date  . ENDOMETRIAL ABLATION  2006   . KNEE SURGERY  2012, 2004    both knees, meniscus surgery     ROS: Review of Systems Negative except as above PHYSICAL EXAM: BP 135/85   Pulse 67   Temp 98.4 F (36.9 C) (Oral)   Resp 16   Wt 215 lb 12.8 oz (97.9 kg)   SpO2 98%   BMI 31.87 kg/m   Physical Exam  General appearance - alert, well appearing, and in no distress Musculoskeletal -patient has varus deformity of the knees.  Both knee joints are enlarged.  She has moderate crepitus and discomfort with passive movement of the left knee.  Minimal crepitus and discomfort with passive movement of the right knee.  Mild tenderness on palpation of the lumbar spine.  Straight leg raise negative on both  sides. Extremities -no lower extremity edema.  Results for orders placed or performed in visit on 09/09/17  POCT glucose (manual entry)  Result Value Ref Range   POC Glucose 97 70 - 99 mg/dl   Lab  Results  Component Value Date   HGBA1C 8.1 (A) 07/05/2017    ASSESSMENT AND PLAN: 1. Primary osteoarthritis of both knees 2. Chronic midline low back pain with left-sided sciatica -Recommend Ortho referral for her knee and lower back.  She will apply for the orange card/cone discount card and when she is approved she will let me know. -She will continue to use the tramadol as needed for pain. 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  She does not feel that she would be able to as much as she would like to.  I told her that she may have to find another job does not require as much walking and standing  3. DM type 2, uncontrolled, with neuropathy ([place]) Not addressed on today's visit - POCT glucose (manual entry)  Patient was given the opportunity to ask questions.  Patient verbalized understanding of the plan and was able to repeat key elements of the plan.   Orders Placed This Encounter  Procedures  . POCT glucose (manual entry)     Requested Prescriptions    No prescriptions requested or ordered in this encounter    Return in about 1 month (around 10/09/2017).  Karle Plumber, MD, FACP

## 2017-09-09 NOTE — Patient Instructions (Signed)
Please apply for the orange card/cone discount card and let me know once you are approved so that we can refer to orthopedics for your back and knee.  Avoid taking ibuprofen, Naprosyn, Aleve and Motrin as these will make your kidney function worse.  Use the tramadol as needed for pain.  You can take Tylenol 500 mg 2-3 times daily as needed with the tramadol.

## 2017-09-09 NOTE — Progress Notes (Signed)
Pt states she has pain in her legs an back

## 2017-09-20 MED FILL — TRUE METRIX TEST STRIP: 30 days supply | Qty: 100 | Fill #1

## 2017-09-20 MED FILL — LEVOTHYROXINE 25 MCG TABLET: 25 | 30 days supply | Qty: 30 | Fill #1

## 2018-11-01 NOTE — Congregational Nurse Program (Signed)
11/01/18 Text from client that she has had a UTI and treated with Kelfex.  She is diabetic but no currently going to PCP.  Have been referring her to orange card or [facility], but complicated by not being able to get information from IRS and file for federal taxes.  She has had some left over Metformin from last prescription.  She took glucose this morning at it was 115 fasting.  She had taken a metformin last night.  She had some stressors yesterday as husband's coworker was in an accident and she did not know outcome.  Will have her report blood sugars for several days.  Vinnie Langton, RN  [phone]

## 2018-12-14 ENCOUNTER — Encounter (HOSPITAL_COMMUNITY): Payer: Self-pay | Admitting: Emergency Medicine

## 2018-12-14 ENCOUNTER — Other Ambulatory Visit: Payer: Self-pay

## 2018-12-14 ENCOUNTER — Ambulatory Visit (HOSPITAL_COMMUNITY)
Admission: EM | Admit: 2018-12-14 | Discharge: 2018-12-14 | Disposition: A | Discharge status: Home / Self Care | Payer: Self-pay | Attending: Family Medicine | Admitting: Family Medicine

## 2018-12-14 DIAGNOSIS — E1165 Type 2 diabetes mellitus with hyperglycemia: Secondary | ICD-10-CM | POA: Insufficient documentation

## 2018-12-14 DIAGNOSIS — R300 Dysuria: Secondary | ICD-10-CM | POA: Insufficient documentation

## 2018-12-14 DIAGNOSIS — I10 Essential (primary) hypertension: Secondary | ICD-10-CM | POA: Insufficient documentation

## 2018-12-14 LAB — POCT URINALYSIS DIP (DEVICE)
Bilirubin Urine: NEGATIVE
Glucose, UA: NEGATIVE mg/dL
Ketones, ur: NEGATIVE mg/dL
Nitrite: NEGATIVE
Protein, ur: NEGATIVE mg/dL
Specific Gravity, Urine: 1.025 (ref 1.005–1.030)
Urobilinogen, UA: 0.2 mg/dL (ref 0.0–1.0)
pH: 6.5 (ref 5.0–8.0)

## 2018-12-14 MED ORDER — LISINOPRIL 20 MG PO TABS: 20.0000 mg | ORAL_TABLET | Freq: Every day | ORAL | 2 refills | Status: AC

## 2018-12-14 MED ORDER — METFORMIN HCL ER 500 MG PO TB24: 500.0000 mg | ORAL_TABLET | Freq: Every day | ORAL | 2 refills | Status: AC

## 2018-12-14 MED ORDER — CEPHALEXIN 500 MG PO CAPS: 500.0000 mg | ORAL_CAPSULE | Freq: Two times a day (BID) | ORAL | 0 refills | Status: AC

## 2018-12-14 NOTE — Discharge Instructions (Addendum)
Your blood pressure was noted to be elevated during your visit today. You may return here within the next 1-2 weeks to recheck if unable to see your primary care doctor. Begin taking the blood pressure prescribed.  BP (!) 194/96 (BP Location: Left Arm) Comment (BP Location): large cuff  Pulse 84   Temp 98.8 F (37.1 C) (Oral)   Resp 18   SpO2 96%

## 2018-12-14 NOTE — ED Triage Notes (Signed)
Patient reports urine smells very strong.  Sometimes has dull pain with urination.  Symptoms noticed "September".    Patient checked blood pressure at a store today, bp 209/100 and has right shoulder pain

## 2018-12-14 NOTE — ED Provider Notes (Signed)
[facility]    ASSESSMENT & PLAN:  1. Dysuria   2. Uncontrolled hypertension   3. Type 2 diabetes mellitus with hyperglycemia, without long-term current use of insulin (HCC)     Begin: Meds ordered this encounter  Medications  . lisinopril (ZESTRIL) 20 MG tablet    Sig: Take 1 tablet (20 mg total) by mouth daily.    Dispense:  30 tablet    Refill:  2  . cephALEXin (KEFLEX) 500 MG capsule    Sig: Take 1 capsule (500 mg total) by mouth 2 (two) times daily.    Dispense:  10 capsule    Refill:  0  . metFORMIN (GLUCOPHAGE XR) 500 MG 24 hr tablet    Sig: Take 1 tablet (500 mg total) by mouth daily with breakfast.    Dispense:  30 tablet    Refill:  2    ECG reviewed by me: NSR. No STEMI. No worrisome changes. 05/18/2016 comparison.  Stressed importance of est care with a PCP. She voices understanding. Will check blood sugars regularly over the next 1-2 weeks. No signs of pyelonephritis. Discussed. Urine culture sent. Will notify patient when results available. Will follow up with her PCP or here if not showing improvement over the next 48 hours, sooner if needed.  Outlined signs and symptoms indicating need for more acute intervention. Patient verbalized understanding. After Visit Summary given.  SUBJECTIVE:  Tanya Ball is a 58 y.o. female who complains of urinary frequency and dysuria. Sporadic for the past couple of weeks. Reports strong urine odor. Without associated flank pain, fever, chills, vaginal discharge or bleeding. Gross hematuria: not present. No specific aggravating or alleviating factors reported. No LE edema. Normal PO intake without n/v/d. Without specific abdominal pain. Ambulatory without difficulty. OTC treatment: none. No h/o frequent UTI reported.  LMP: No LMP recorded. Patient has had an ablation.  Also reports concern over blood pressure. Increased blood pressure noted today. Reports that she is not currently treated for HTN. Has  been on BP medication in the past.  She reports no chest pain on exertion, no dyspnea on exertion, no swelling of ankles, no orthostatic dizziness or lightheadedness, no orthopnea or paroxysmal nocturnal dyspnea, no palpitations and no intermittent claudication symptoms. Does report right shoulder discomfort described as "an ache"; noticed several months ago. No worsening.  Also with DM. Blood sugars usually 180 or less. Not taking medications.  Would like refills of BP and DM medication.  ROS: As in HPI. All other systems negative.   OBJECTIVE:  Vitals:   12/14/18 1347  BP: (!) 194/96  Pulse: 84  Resp: 18  Temp: 98.8 F (37.1 C)  TempSrc: Oral  SpO2: 96%   General appearance: alert; no distress HENT: oropharynx: moist Lungs: unlabored respirations CV: RRR Abdomen: soft, non-tender; bowel sounds normal; no masses or organomegaly; no guarding or rebound tenderness Back: no CVA tenderness Extremities: no edema; symmetrical with no gross deformities; R shoulder with normal exam Skin: warm and dry Neurologic: normal gait Psychological: alert and cooperative; normal mood and affect  Labs Reviewed - No data to display  Allergies  Allergen Reactions  . Iodine Hives  . Macrobid [Nitrofurantoin Monohyd Macro] Nausea Only  . Glipizide Other (See Comments)    Shaky and lightheaded   . Sulfa Antibiotics Rash    Past Medical History:  Diagnosis Date  . ADD (attention deficit disorder) 2002  . Arthritis 2006   knees, fingers   . Asthma 2004  . Diabetes  mellitus without complication ([place]) 6440  . Hypertension 2009  . Hypothyroidism 2008    previously on levothyroxine   . Sciatica    Social History   Socioeconomic History  . Marital status: Married    Spouse name: Not on file  . Number of children: 2   . Years of education: some colle  . Highest education level: Not on file  Occupational History  . Occupation: Presenter, broadcasting   Tobacco Use  . Smoking status: Former  Research scientist (life sciences)  . Smokeless tobacco: Never Used  Substance and Sexual Activity  . Alcohol use: No  . Drug use: No  . Sexual activity: Yes    Birth control/protection: None  Other Topics Concern  . Not on file  Social History Narrative   Lives with husband and son (72).   One son in [place] ( 20)    Social Determinants of Health   Financial Resource Strain:   . Difficulty of Paying Living Expenses: Not on file  Food Insecurity:   . Worried About Charity fundraiser in the Last Year: Not on file  . Ran Out of Food in the Last Year: Not on file  Transportation Needs:   . Lack of Transportation (Medical): Not on file  . Lack of Transportation (Non-Medical): Not on file  Physical Activity:   . Days of Exercise per Week: Not on file  . Minutes of Exercise per Session: Not on file  Stress:   . Feeling of Stress : Not on file  Social Connections:   . Frequency of Communication with Friends and Family: Not on file  . Frequency of Social Gatherings with Friends and Family: Not on file  . Attends Religious Services: Not on file  . Active Member of Clubs or Organizations: Not on file  . Attends Archivist Meetings: Not on file  . Marital Status: Not on file  Intimate Partner Violence:   . Fear of Current or Ex-Partner: Not on file  . Emotionally Abused: Not on file  . Physically Abused: Not on file  . Sexually Abused: Not on file   Family History  Problem Relation Age of Onset  . Diabetes Mother   . Liver disease Father   . Cancer Brother        Hodgkin's Lymphoma   . Aneurysm Maternal Grandmother [address]       Vanessa Kick, MD 12/14/18 [phone]

## 2018-12-16 LAB — URINE CULTURE: Culture: >=100000 — AB

## 2019-10-25 DIAGNOSIS — IMO0002 Reserved for concepts with insufficient information to code with codable children: Secondary | ICD-10-CM

## 2019-10-25 DIAGNOSIS — E1140 Type 2 diabetes mellitus with diabetic neuropathy, unspecified: Secondary | ICD-10-CM

## 2019-11-15 NOTE — Congregational Nurse Program (Signed)
Saw [employer] intern today and was concerned about blood sugar as does not have glucometer.  Let her know BS was 127 at [facility] on 10/25/19.,  I will check to see if we can get glucometer to check her blood sugar.  I think she needs a annual physical.  Juliann Pulse, RN

## 2019-11-16 LAB — GLUCOSE, POCT (MANUAL RESULT ENTRY): POC Glucose: 127 mg/dl — AB (ref 70–99)

## 2019-11-16 NOTE — Congregational Nurse Program (Signed)
°  Dept: [phone]   Congregational Nurse Program Note  Date of Encounter: 10/25/2019  Past Medical History: Past Medical History:  Diagnosis Date   ADD (attention deficit disorder) 2002   Arthritis 2006   knees, fingers    Asthma 2004   Diabetes mellitus without complication (HCC) 2008   Hypertension 2009   Hypothyroidism 2008    previously on levothyroxine    Sciatica     Encounter Details:  CNP Questionnaire - 10/25/19 1518      Questionnaire   Do you give verbal consent to treat you today? Yes    Visit Setting Church or Organization    Location Patient Served At [facility]    Patient Status Not Applicable    Medical Provider No    Insurance Private Insurance    Intervention Assess (including screenings)    Referrals Other   hearing   Screening Referrals Annual Wellness Visit

## 2020-04-15 ENCOUNTER — Emergency Department
Admission: EM | Admit: 2020-04-15 | Discharge: 2020-04-15 | Disposition: A | Discharge status: Home / Self Care | Payer: Commercial Managed Care - PPO | Attending: Emergency Medicine | Admitting: Emergency Medicine

## 2020-04-15 ENCOUNTER — Other Ambulatory Visit: Payer: Self-pay

## 2020-04-15 ENCOUNTER — Emergency Department: Payer: Commercial Managed Care - PPO

## 2020-04-15 DIAGNOSIS — E1140 Type 2 diabetes mellitus with diabetic neuropathy, unspecified: Secondary | ICD-10-CM | POA: Diagnosis not present

## 2020-04-15 DIAGNOSIS — Z794 Long term (current) use of insulin: Secondary | ICD-10-CM | POA: Diagnosis not present

## 2020-04-15 DIAGNOSIS — J45909 Unspecified asthma, uncomplicated: Secondary | ICD-10-CM | POA: Insufficient documentation

## 2020-04-15 DIAGNOSIS — I10 Essential (primary) hypertension: Secondary | ICD-10-CM | POA: Insufficient documentation

## 2020-04-15 DIAGNOSIS — Z7952 Long term (current) use of systemic steroids: Secondary | ICD-10-CM | POA: Diagnosis not present

## 2020-04-15 DIAGNOSIS — Z87891 Personal history of nicotine dependence: Secondary | ICD-10-CM | POA: Diagnosis not present

## 2020-04-15 DIAGNOSIS — R0789 Other chest pain: Secondary | ICD-10-CM | POA: Diagnosis present

## 2020-04-15 DIAGNOSIS — E039 Hypothyroidism, unspecified: Secondary | ICD-10-CM | POA: Diagnosis not present

## 2020-04-15 DIAGNOSIS — Z79899 Other long term (current) drug therapy: Secondary | ICD-10-CM | POA: Insufficient documentation

## 2020-04-15 LAB — CBC
HCT: 41.3 % (ref 36.0–46.0)
Hemoglobin: 13.7 g/dL (ref 12.0–15.0)
MCH: 28.1 pg (ref 26.0–34.0)
MCHC: 33.2 g/dL (ref 30.0–36.0)
MCV: 84.6 fL (ref 80.0–100.0)
Platelets: 161 10*3/uL (ref 150–400)
RBC: 4.88 MIL/uL (ref 3.87–5.11)
RDW: 13.2 % (ref 11.5–15.5)
WBC: 7.3 10*3/uL (ref 4.0–10.5)
nRBC: 0 % (ref 0.0–0.2)

## 2020-04-15 LAB — BASIC METABOLIC PANEL
Anion gap: 11 (ref 5–15)
BUN: 25 mg/dL — ABNORMAL HIGH (ref 6–20)
CO2: 24 mmol/L (ref 22–32)
Calcium: 9.3 mg/dL (ref 8.9–10.3)
Chloride: 103 mmol/L (ref 98–111)
Creatinine, Ser: 1.23 mg/dL — ABNORMAL HIGH (ref 0.44–1.00)
GFR, Estimated: 50 mL/min — ABNORMAL LOW (ref >60)
Glucose, Bld: 120 mg/dL — ABNORMAL HIGH (ref 70–99)
Potassium: 3.6 mmol/L (ref 3.5–5.1)
Sodium: 138 mmol/L (ref 135–145)

## 2020-04-15 LAB — TROPONIN I (HIGH SENSITIVITY): Troponin I (High Sensitivity): 6 ng/L (ref <18)

## 2020-04-15 MED ORDER — CLONIDINE HCL 0.1 MG PO TABS
0.2000 mg | ORAL_TABLET | Freq: Once | ORAL | Status: AC
  Administered 2020-04-15: 0.2 mg via ORAL
  Filled 2020-04-15: qty 2

## 2020-04-15 NOTE — ED Notes (Signed)
Pt alert and oriented X 4, stable for discharge. RR even and unlabored, color WNL. Discussed discharge instructions and follow-up as directed. Discharge medications discussed if prescribed. Pt had opportunity to ask questions, and RN to provide patient/family eduction.  

## 2020-04-15 NOTE — ED Provider Notes (Addendum)
[facility] Emergency Department Provider Note ____________________________________________   Event Date/Time   First MD Initiated Contact with Patient 04/15/20 1149     (approximate)  I have reviewed the triage vital signs and the nursing notes.   HISTORY  Chief Complaint Chest Pain    HPI Margarethe Virgen is a 60 y.o. female with PMH as noted below including a history of hypertension who presents with elevated blood pressures over the last several days associated with some intermittent chest discomfort described as tightness or pressure.  It is not exertional and not present all the time.  She denies any associated shortness of breath although she has had some lightheadedness.  The patient states that she has been under increased stress due to a custody dispute that her son has been involved in with his wife.  She also reports that she previously had been on lisinopril, was able to discontinue it when she changed her diet and her blood pressure stabilized.  However, she was just restarted on the lisinopril 5 days ago.  She states that she has been compliant with that since that time.  Past Medical History:  Diagnosis Date  . ADD (attention deficit disorder) 2002  . Arthritis 2006   knees, fingers   . Asthma 2004  . Diabetes mellitus without complication (HCC) 2008  . Hypertension 2009  . Hypothyroidism 2008    previously on levothyroxine   . Sciatica     Patient Active Problem List   Diagnosis Date Noted  . Bilateral chronic knee pain 08/05/2016  . Chronic left hip pain 08/05/2016  . HTN (hypertension) 08/18/2015  . Obesity (BMI 30-39.9) 09/05/2013  . ADD (attention deficit disorder) 09/05/2013  . Depression 09/05/2013  . Subclinical hypothyroidism   . DM type 2, uncontrolled, with neuropathy [facility])     Past Surgical History:  Procedure Laterality Date  . ENDOMETRIAL ABLATION  2006   . KNEE SURGERY  2012, 2004    both knees, meniscus  surgery     Prior to Admission medications   Medication Sig Start Date End Date Taking? Authorizing Provider  cephALEXin (KEFLEX) 500 MG capsule Take 1 capsule (500 mg total) by mouth 2 (two) times daily. 12/14/18   Mardella Layman, MD  lisinopril (ZESTRIL) 20 MG tablet Take 1 tablet (20 mg total) by mouth daily. 12/14/18   Mardella Layman, MD  metFORMIN (GLUCOPHAGE XR) 500 MG 24 hr tablet Take 1 tablet (500 mg total) by mouth daily with breakfast. 12/14/18   Mardella Layman, MD  albuterol (PROVENTIL HFA;VENTOLIN HFA) 108 (90 Base) MCG/ACT inhaler Inhale 2 puffs into the lungs every 6 (six) hours as needed for wheezing or shortness of breath. Patient not taking: Reported on 07/13/2017 08/01/15 12/14/18  Dessa Phi, MD  fluticasone (FLONASE) 50 MCG/ACT nasal spray Place 2 sprays into both nostrils daily. 07/13/17 12/14/18  Marcine Matar, MD  levothyroxine (SYNTHROID, LEVOTHROID) 25 MCG tablet Take 1 tablet (25 mcg total) by mouth daily. 07/06/17 12/14/18  Marcine Matar, MD  pravastatin (PRAVACHOL) 20 MG tablet Take 1 tablet (20 mg total) by mouth daily. 07/06/17 12/14/18  Marcine Matar, MD    Allergies Iodine, Macrobid [nitrofurantoin monohyd macro], Glipizide, and Sulfa antibiotics  Family History  Problem Relation Age of Onset  . Diabetes Mother   . Liver disease Father   . Cancer Brother        Hodgkin's Lymphoma   . Aneurysm Maternal Grandmother 31    Social History Social History  Tobacco Use  . Smoking status: Former Games developer  . Smokeless tobacco: Never Used  Substance Use Topics  . Alcohol use: No  . Drug use: No    Review of Systems  Constitutional: No fever. Eyes: No redness. ENT: No sore throat. Cardiovascular: Positive for intermittent chest pain. Respiratory: Denies shortness of breath. Gastrointestinal: No vomiting or diarrhea.  Genitourinary: Negative for dysuria.  Musculoskeletal: Negative for back pain. Skin: Negative for rash. Neurological:  Negative for headache.   ____________________________________________   PHYSICAL EXAM:  VITAL SIGNS: ED Triage Vitals  Enc Vitals Group     BP 04/15/20 1138 (!) 200/99     Pulse Rate 04/15/20 1138 82     Resp 04/15/20 1138 15     Temp 04/15/20 1138 99 F (37.2 C)     Temp Source 04/15/20 1138 Oral     SpO2 04/15/20 1138 99 %     Weight 04/15/20 1135 223 lb (101.2 kg)     Height 04/15/20 1135 5\' 9"  (1.753 m)     Head Circumference --      Peak Flow --      Pain Score 04/15/20 1134 2     Pain Loc --      Pain Edu? --      Excl. in GC? --     Constitutional: Alert and oriented. Well appearing and in no acute distress. Eyes: Conjunctivae are normal.  Head: Atraumatic. Nose: No congestion/rhinnorhea. Mouth/Throat: Mucous membranes are moist.   Neck: Normal range of motion.  Cardiovascular: Normal rate, regular rhythm.   Good peripheral circulation. Respiratory: Normal respiratory effort.  No retractions.  Gastrointestinal: No distention.  Musculoskeletal: No lower extremity edema.  Extremities warm and well perfused.  Neurologic:  Normal speech and language. No gross focal neurologic deficits are appreciated.  Skin:  Skin is warm and dry. No rash noted. Psychiatric: Mood and affect are normal. Speech and behavior are normal.  ____________________________________________   LABS (all labs ordered are listed, but only abnormal results are displayed)  Labs Reviewed  BASIC METABOLIC PANEL - Abnormal; Notable for the following components:      Result Value   Glucose, Bld 120 (*)    BUN 25 (*)    Creatinine, Ser 1.23 (*)    GFR, Estimated 50 (*)    All other components within normal limits  CBC  POC URINE PREG, ED  TROPONIN I (HIGH SENSITIVITY)   ____________________________________________  EKG  ED ECG REPORT I, 06/15/20, the attending physician, personally viewed and interpreted this ECG.  Date: 04/15/2020 EKG Time: 1135 Rate: 76 Rhythm: normal  sinus rhythm QRS Axis: normal Intervals: normal ST/T Wave abnormalities: normal Narrative Interpretation: no evidence of acute ischemia  ____________________________________________  RADIOLOGY  Chest x-ray interpreted by me shows no focal consolidation or edema  ____________________________________________   PROCEDURES  Procedure(s) performed: No  Procedures  Critical Care performed: No ____________________________________________   INITIAL IMPRESSION / ASSESSMENT AND PLAN / ED COURSE  Pertinent labs & imaging results that were available during my care of the patient were reviewed by me and considered in my medical decision making (see chart for details).  60 year old female with history of hypertension recently restarted on lisinopril presents with elevated blood pressures over the last several days.  The patient has had some intermittent atypical chest discomfort recently as well as some lightheadedness.  She denies other acute symptoms.  She does report increased stress.  She was sent from urgent care due to reporting chest pain  I reviewed the past medical records in Epic.  The patient was last seen in the ED in 2020 with dysuria and uncontrolled hypertension, although it sounds like she has been off of lisinopril for the last few years until 5 days ago.  On exam the patient is overall well-appearing although somewhat anxious.  Her vital signs are normal except for hypertension.  The physical exam is otherwise unremarkable.  EKG is nonischemic.  Overall presentation is consistent with essential hypertension.  I have a low suspicion for ACS or other acute cardiac pathology.  EKG is normal.  We will obtain basic labs and a troponin.  There is no evidence of PE, aortic dissection, or other vascular cause given the mild and intermittent nature of the pain.  I suspect that the hypertension is exacerbated likely due to stress and since the patient has only restarted the  lisinopril for few days.  We will give a dose of clonidine here in the ED.  ----------------------------------------- 1:09 PM on 04/15/2020 -----------------------------------------  Troponin is negative.  There is no evidence of ACS or indication for a repeat, given the duration of the symptoms and normal EKG.  The blood pressure already started to improve before clonidine was given.  Basic labs are unremarkable.  At this time, the patient appears comfortable.  She is stable for discharge home.  I counseled her on the results of the work-up.  She agrees to continue lisinopril as prescribed and follow-up with her primary care doctor.  Return precautions given, and she expresses understanding.  ____________________________________________   FINAL CLINICAL IMPRESSION(S) / ED DIAGNOSES  Final diagnoses:  Hypertension, unspecified type  Atypical chest pain      NEW MEDICATIONS STARTED DURING THIS VISIT:  New Prescriptions   No medications on file     Note:  This document was prepared using Dragon voice recognition software and may include unintentional dictation errors.       Dionne Bucy, MD 04/15/20 1330

## 2020-04-15 NOTE — Discharge Instructions (Addendum)
Continue taking your lisinopril as prescribed.  Follow-up with your primary care doctor.  Return to the ER for new, worsening, or persistent severe chest pain, difficulty breathing, weakness or lightheadedness, or persistently elevated blood pressures.

## 2020-04-15 NOTE — ED Triage Notes (Signed)
Pt via POV from home. Pt was seen at [facility] for chest pressure for couple months. Denies SOB. Pt states that her BP has been increased. Pt is A&Ox4 and NAD.

## 2020-04-17 ENCOUNTER — Emergency Department: Payer: No Typology Code available for payment source

## 2020-04-17 ENCOUNTER — Other Ambulatory Visit: Payer: Self-pay

## 2020-04-17 DIAGNOSIS — I10 Essential (primary) hypertension: Secondary | ICD-10-CM | POA: Insufficient documentation

## 2020-04-17 DIAGNOSIS — Z87891 Personal history of nicotine dependence: Secondary | ICD-10-CM | POA: Insufficient documentation

## 2020-04-17 DIAGNOSIS — J45909 Unspecified asthma, uncomplicated: Secondary | ICD-10-CM | POA: Diagnosis not present

## 2020-04-17 DIAGNOSIS — Z79899 Other long term (current) drug therapy: Secondary | ICD-10-CM | POA: Insufficient documentation

## 2020-04-17 DIAGNOSIS — Z7984 Long term (current) use of oral hypoglycemic drugs: Secondary | ICD-10-CM | POA: Insufficient documentation

## 2020-04-17 DIAGNOSIS — E1140 Type 2 diabetes mellitus with diabetic neuropathy, unspecified: Secondary | ICD-10-CM | POA: Diagnosis not present

## 2020-04-17 DIAGNOSIS — E039 Hypothyroidism, unspecified: Secondary | ICD-10-CM | POA: Diagnosis not present

## 2020-04-17 NOTE — ED Triage Notes (Signed)
Pt has hx hypertension, pt states she was seen a few days ago for the same thing. Pt states she checked her BP tonight and it was 200 systolic. Pt states she feels faint like she is going to pass out. Pt c/o slight headache

## 2020-04-18 ENCOUNTER — Emergency Department
Admission: EM | Admit: 2020-04-18 | Discharge: 2020-04-18 | Disposition: A | Discharge status: Home / Self Care | Payer: No Typology Code available for payment source | Attending: Emergency Medicine | Admitting: Emergency Medicine

## 2020-04-18 DIAGNOSIS — I10 Essential (primary) hypertension: Secondary | ICD-10-CM

## 2020-04-18 LAB — URINALYSIS, ROUTINE W REFLEX MICROSCOPIC
Bacteria, UA: NONE SEEN
Bilirubin Urine: NEGATIVE
Glucose, UA: NEGATIVE mg/dL
Ketones, ur: 5 mg/dL — AB
Nitrite: NEGATIVE
Protein, ur: NEGATIVE mg/dL
Specific Gravity, Urine: 1.006 (ref 1.005–1.030)
Squamous Epithelial / HPF: NONE SEEN (ref 0–5)
pH: 6 (ref 5.0–8.0)

## 2020-04-18 LAB — CBC WITH DIFFERENTIAL/PLATELET
Abs Immature Granulocytes: 0.03 10*3/uL (ref 0.00–0.07)
Basophils Absolute: 0 10*3/uL (ref 0.0–0.1)
Basophils Relative: 1 %
Eosinophils Absolute: 0.2 10*3/uL (ref 0.0–0.5)
Eosinophils Relative: 2 %
HCT: 39.9 % (ref 36.0–46.0)
Hemoglobin: 13.2 g/dL (ref 12.0–15.0)
Immature Granulocytes: 0 %
Lymphocytes Relative: 31 %
Lymphs Abs: 2.7 10*3/uL (ref 0.7–4.0)
MCH: 28 pg (ref 26.0–34.0)
MCHC: 33.1 g/dL (ref 30.0–36.0)
MCV: 84.5 fL (ref 80.0–100.0)
Monocytes Absolute: 0.7 10*3/uL (ref 0.1–1.0)
Monocytes Relative: 8 %
Neutro Abs: 5.1 10*3/uL (ref 1.7–7.7)
Neutrophils Relative %: 58 %
Platelets: 158 10*3/uL (ref 150–400)
RBC: 4.72 MIL/uL (ref 3.87–5.11)
RDW: 13.3 % (ref 11.5–15.5)
WBC: 8.7 10*3/uL (ref 4.0–10.5)
nRBC: 0 % (ref 0.0–0.2)

## 2020-04-18 LAB — BASIC METABOLIC PANEL
Anion gap: 11 (ref 5–15)
BUN: 24 mg/dL — ABNORMAL HIGH (ref 6–20)
CO2: 22 mmol/L (ref 22–32)
Calcium: 9.1 mg/dL (ref 8.9–10.3)
Chloride: 106 mmol/L (ref 98–111)
Creatinine, Ser: 1.39 mg/dL — ABNORMAL HIGH (ref 0.44–1.00)
GFR, Estimated: 43 mL/min — ABNORMAL LOW (ref >60)
Glucose, Bld: 93 mg/dL (ref 70–99)
Potassium: 3.2 mmol/L — ABNORMAL LOW (ref 3.5–5.1)
Sodium: 139 mmol/L (ref 135–145)

## 2020-04-18 LAB — TROPONIN I (HIGH SENSITIVITY)
Troponin I (High Sensitivity): 5 ng/L (ref <18)
Troponin I (High Sensitivity): 6 ng/L (ref <18)

## 2020-04-18 NOTE — ED Provider Notes (Signed)
[facility] Emergency Department Provider Note  ____________________________________________   Event Date/Time   First MD Initiated Contact with Patient 04/18/20 0141     (approximate)  I have reviewed the triage vital signs and the nursing notes.   HISTORY  Chief Complaint Hypertension    HPI Tanya Ball is a 60 y.o. female with history of hypertension, diabetes, hypothyroidism, obesity who presents to the emergency department with concerns for hypertension.  States that she has not been to a doctor in "a while".  She started checking her blood pressures a few days ago and states that they have been elevated.  She was seen at urgent care and was started back on her lisinopril 20 mg several days ago.  Was seen in the emergency department 2 days ago with unremarkable work-up.  Back tonight because she states her blood pressure at home was 220/100.  She reports she has had intermittent palpitations where she felt a "pounding" in her chest with associated chest pressure, shortness of breath and felt like she was going to pass out tonight.  States that these episodes only last for a few seconds and then resolved.  They also report a very brief episode of what she describes as binocular diplopia when looking at the clock in the waiting room but this resolved almost immediately.  Has had mild frontal headaches.  No current vision changes.  No numbness or focal weakness.  No current chest pain or palpitations.  She states she called her primary care doctor today to schedule an appointment but has not heard back yet.        Past Medical History:  Diagnosis Date  . ADD (attention deficit disorder) 2002  . Arthritis 2006   knees, fingers   . Asthma 2004  . Diabetes mellitus without complication (HCC) 2008  . Hypertension 2009  . Hypothyroidism 2008    previously on levothyroxine   . Sciatica     Patient Active Problem List   Diagnosis Date Noted  .  Bilateral chronic knee pain 08/05/2016  . Chronic left hip pain 08/05/2016  . HTN (hypertension) 08/18/2015  . Obesity (BMI 30-39.9) 09/05/2013  . ADD (attention deficit disorder) 09/05/2013  . Depression 09/05/2013  . Subclinical hypothyroidism   . DM type 2, uncontrolled, with neuropathy [facility](HCC)     Past Surgical History:  Procedure Laterality Date  . ENDOMETRIAL ABLATION  2006   . KNEE SURGERY  2012, 2004    both knees, meniscus surgery     Prior to Admission medications   Medication Sig Start Date End Date Taking? Authorizing Provider  cephALEXin (KEFLEX) 500 MG capsule Take 1 capsule (500 mg total) by mouth 2 (two) times daily. 12/14/18   Mardella LaymanHagler, Brian, MD  lisinopril (ZESTRIL) 20 MG tablet Take 1 tablet (20 mg total) by mouth daily. 12/14/18   Mardella LaymanHagler, Brian, MD  metFORMIN (GLUCOPHAGE XR) 500 MG 24 hr tablet Take 1 tablet (500 mg total) by mouth daily with breakfast. 12/14/18   Mardella LaymanHagler, Brian, MD  albuterol (PROVENTIL HFA;VENTOLIN HFA) 108 (90 Base) MCG/ACT inhaler Inhale 2 puffs into the lungs every 6 (six) hours as needed for wheezing or shortness of breath. Patient not taking: Reported on 07/13/2017 08/01/15 12/14/18  Dessa PhiFunches, Josalyn, MD  fluticasone (FLONASE) 50 MCG/ACT nasal spray Place 2 sprays into both nostrils daily. 07/13/17 12/14/18  Marcine MatarJohnson, Deborah B, MD  levothyroxine (SYNTHROID, LEVOTHROID) 25 MCG tablet Take 1 tablet (25 mcg total) by mouth daily. 07/06/17 12/14/18  Jonah BlueJohnson, Deborah  B, MD  pravastatin (PRAVACHOL) 20 MG tablet Take 1 tablet (20 mg total) by mouth daily. 07/06/17 12/14/18  Marcine Matar, MD    Allergies Iodine, Macrobid [nitrofurantoin monohyd macro], Glipizide, and Sulfa antibiotics  Family History  Problem Relation Age of Onset  . Diabetes Mother   . Liver disease Father   . Cancer Brother        Hodgkin's Lymphoma   . Aneurysm Maternal Grandmother 34    Social History Social History   Tobacco Use  . Smoking status: Former Games developer  .  Smokeless tobacco: Never Used  Substance Use Topics  . Alcohol use: No  . Drug use: No    Review of Systems Constitutional: No fever. Eyes: No visual changes. ENT: No sore throat. Cardiovascular: Denies chest pain. Respiratory: Denies shortness of breath. Gastrointestinal: No nausea, vomiting, diarrhea. Genitourinary: Negative for dysuria. Musculoskeletal: Negative for back pain. Skin: Negative for rash. Neurological: Negative for focal weakness or numbness.  ____________________________________________   PHYSICAL EXAM:  VITAL SIGNS: ED Triage Vitals  Enc Vitals Group     BP 04/17/20 2342 (!) 212/81     Pulse Rate 04/17/20 2340 81     Resp 04/17/20 2340 18     Temp 04/17/20 2340 97.9 F (36.6 C)     Temp Source 04/17/20 2340 Oral     SpO2 04/17/20 2340 100 %     Weight 04/17/20 2338 223 lb (101.2 kg)     Height 04/17/20 2338 5\' 9"  (1.753 m)     Head Circumference --      Peak Flow --      Pain Score 04/17/20 2338 2     Pain Loc --      Pain Edu? --      Excl. in GC? --    CONSTITUTIONAL: Alert and oriented and responds appropriately to questions. Well-appearing; well-nourished HEAD: Normocephalic EYES: Conjunctivae clear, pupils appear equal, EOM appear intact ENT: normal nose; moist mucous membranes NECK: Supple, normal ROM CARD: RRR; S1 and S2 appreciated; no murmurs, no clicks, no rubs, no gallops RESP: Normal chest excursion without splinting or tachypnea; breath sounds clear and equal bilaterally; no wheezes, no rhonchi, no rales, no hypoxia or respiratory distress, speaking full sentences ABD/GI: Normal bowel sounds; non-distended; soft, non-tender, no rebound, no guarding, no peritoneal signs, no hepatosplenomegaly BACK: The back appears normal EXT: Normal ROM in all joints; no deformity noted, no edema; no cyanosis, no calf tenderness or calf swelling SKIN: Normal color for age and race; warm; no rash on exposed skin NEURO: Moves all extremities equally,  sensation to light touch intact diffusely, cranial nerves II through XII intact, normal speech, normal gait PSYCH: The patient's mood and manner are appropriate.  ____________________________________________   LABS (all labs ordered are listed, but only abnormal results are displayed)  Labs Reviewed  BASIC METABOLIC PANEL - Abnormal; Notable for the following components:      Result Value   Potassium 3.2 (*)    BUN 24 (*)    Creatinine, Ser 1.39 (*)    GFR, Estimated 43 (*)    All other components within normal limits  URINALYSIS, ROUTINE W REFLEX MICROSCOPIC - Abnormal; Notable for the following components:   Color, Urine STRAW (*)    APPearance CLEAR (*)    Hgb urine dipstick SMALL (*)    Ketones, ur 5 (*)    Leukocytes,Ua MODERATE (*)    All other components within normal limits  CBC WITH DIFFERENTIAL/PLATELET  TROPONIN  I (HIGH SENSITIVITY)  TROPONIN I (HIGH SENSITIVITY)   ____________________________________________  EKG   EKG Interpretation  Date/Time:  Thursday April 17 2020 23:39:13 EDT Ventricular Rate:  78 PR Interval:  162 QRS Duration: 80 QT Interval:  400 QTC Calculation: 456 R Axis:   42 Text Interpretation: Normal sinus rhythm Cannot rule out Anterior infarct , age undetermined Abnormal ECG No significant change since last tracing Confirmed by Rochele Raring [phone]) on 04/18/2020 4:12:17 AM       ____________________________________________  RADIOLOGY Tanya Ball, personally viewed and evaluated these images (plain radiographs) as part of my medical decision making, as well as reviewing the written report by the radiologist.  ED MD interpretation: Chest x-ray clear  Official radiology report(s): DG Chest 2 View  Result Date: 04/18/2020 CLINICAL DATA:  Chest pain, hypertension EXAM: CHEST - 2 VIEW COMPARISON:  Radiograph 04/15/2020 FINDINGS: Few bandlike opacities in the lung bases likely reflect atelectasis. No consolidation, features of edema,  pneumothorax, or effusion. Pulmonary vascularity is normally distributed. The cardiomediastinal contours are unremarkable. No acute osseous or soft tissue abnormality. Degenerative changes are present in the imaged spine and shoulders. IMPRESSION: Bibasilar atelectasis. No acute cardiopulmonary abnormality. Electronically Signed   By: Kreg Shropshire M.D.   On: 04/18/2020 00:08    ____________________________________________   PROCEDURES  Procedure(s) performed (including Critical Care):  Procedures   ____________________________________________   INITIAL IMPRESSION / ASSESSMENT AND PLAN / ED COURSE  As part of my medical decision making, I reviewed the following data within the electronic MEDICAL RECORD NUMBER Nursing notes reviewed and incorporated, Labs reviewed , EKG interpreted , Old EKG reviewed, Old chart reviewed, Radiograph reviewed  and Notes from prior ED visits         Patient here with concerns for hypertension with multiple intermittent complaints but is now asymptomatic.  EKG does show some very minimal ST depression in inferior lateral leads that was present on previous EKG.  Her work-up thus far has been reassuring with normal troponin, clear chest x-ray.  Given asymptomatic, doubt PE or dissection.  She does have risk factors for ACS but this is now her third normal troponin in the last 48 hours.  She does have slightly elevated creatinine but this appears chronic for her.  Repeat troponin pending.  We will also obtain urinalysis to evaluate for signs of endorgan damage.  She states she is currently on antibiotics for UTI.  We will continue to monitor her blood pressure.  Initially was in the 200 systolic but has now come down to the 170s.  We will continue to closely monitor but I do not feel that she needs treatment given she is currently asymptomatic.  She states she was just restarted on lisinopril 20 mg this week.  ED PROGRESS  Repeat troponin is flat.  Urine does show  small hemoglobin but no proteinuria.  Moderate leukocytes but no other sign of infection.  Blood pressure has improved to 133/68 without any intervention in the ED.  Continues to be asymptomatic.  I feel she is safe for discharge.  She has called her primary care doctor yesterday to schedule an appointment and will get in contact with them again today to ensure close outpatient follow-up.  Recommended that she keep a log of her blood pressures, checking them twice a day at home.  Discussed return precautions.  I feel she is safe for discharge home.  She is comfortable with this plan.  I do not feel her blood pressure medications  need to be adjusted again from the ED. ____________________________________________   FINAL CLINICAL IMPRESSION(S) / ED DIAGNOSES  Final diagnoses:  Hypertension, unspecified type     ED Discharge Orders    None      *Please note:  Tanya Ball was evaluated in Emergency Department on 04/18/2020 for the symptoms described in the history of present illness. She was evaluated in the context of the global COVID-19 pandemic, which necessitated consideration that the patient might be at risk for infection with the SARS-CoV-2 virus that causes COVID-19. Institutional protocols and algorithms that pertain to the evaluation of patients at risk for COVID-19 are in a state of rapid change based on information released by regulatory bodies including the CDC and federal and state organizations. These policies and algorithms were followed during the patient's care in the ED.  Some ED evaluations and interventions may be delayed as a result of limited staffing during and the pandemic.*   Note:  This document was prepared using Dragon voice recognition software and may include unintentional dictation errors.   Tanya Ball, Layla Maw, DO 04/18/20 0425

## 2020-04-18 NOTE — ED Notes (Signed)
All documentation completed by Olivia Prillaman SRN reviewed and approved by this RN 

## 2021-04-11 ENCOUNTER — Emergency Department
Admission: EM | Admit: 2021-04-11 | Discharge: 2021-04-11 | Discharge status: Absent without leave | Payer: Commercial Managed Care - PPO | Source: Home / Self Care

## 2021-07-17 NOTE — Congregational Nurse Program (Signed)
  Dept: [phone]   Congregational Nurse Program Note  Date of Encounter: 11/01/2018  Past Medical History: Past Medical History:  Diagnosis Date   ADD (attention deficit disorder) 2002   Arthritis 2006   knees, fingers    Asthma 2004   Diabetes mellitus without complication (HCC) 2008   Hypertension 2009   Hypothyroidism 2008    previously on levothyroxine    Sciatica     Encounter Details:

## 2022-01-03 NOTE — Congregational Nurse Program (Signed)
Member seen at [facility].  Attended the graduation celebration.  Has continued to struggle with diabetes.  She vacillates between sticking to keto diet and taking meds.  Has not been consistent with either.  Blood sugars per Judeth Cornfield are around 200 fasting.  States she is taking 1 metformin a day for a couple of weeks.  Discussed that 200 is too high for fasting.  She reports she was prescribed 2 metformin per day.  Suggested she take as prescribed and follow dr. instructions.  Has multiple stressors at home.  Has 2 grandchildren and helping with their care.  Reports that the older one has been diagnosed with autism.  Connected her with autism services thru one of the previous job coaches.  Still limping with "bad knees".  Requested her to call me as she is not currently coming to [facility].  Juliann Pulse, RN, Congregational Nurse, [phone].

## 2022-08-28 IMAGING — CR DG CHEST 2V
2 series · 2 of 2 positions shown · non-contrast
Comparison: May 17, 2016

CLINICAL DATA: Chest pain and hypertension

EXAM:
CHEST - 2 VIEW

[chest pa]
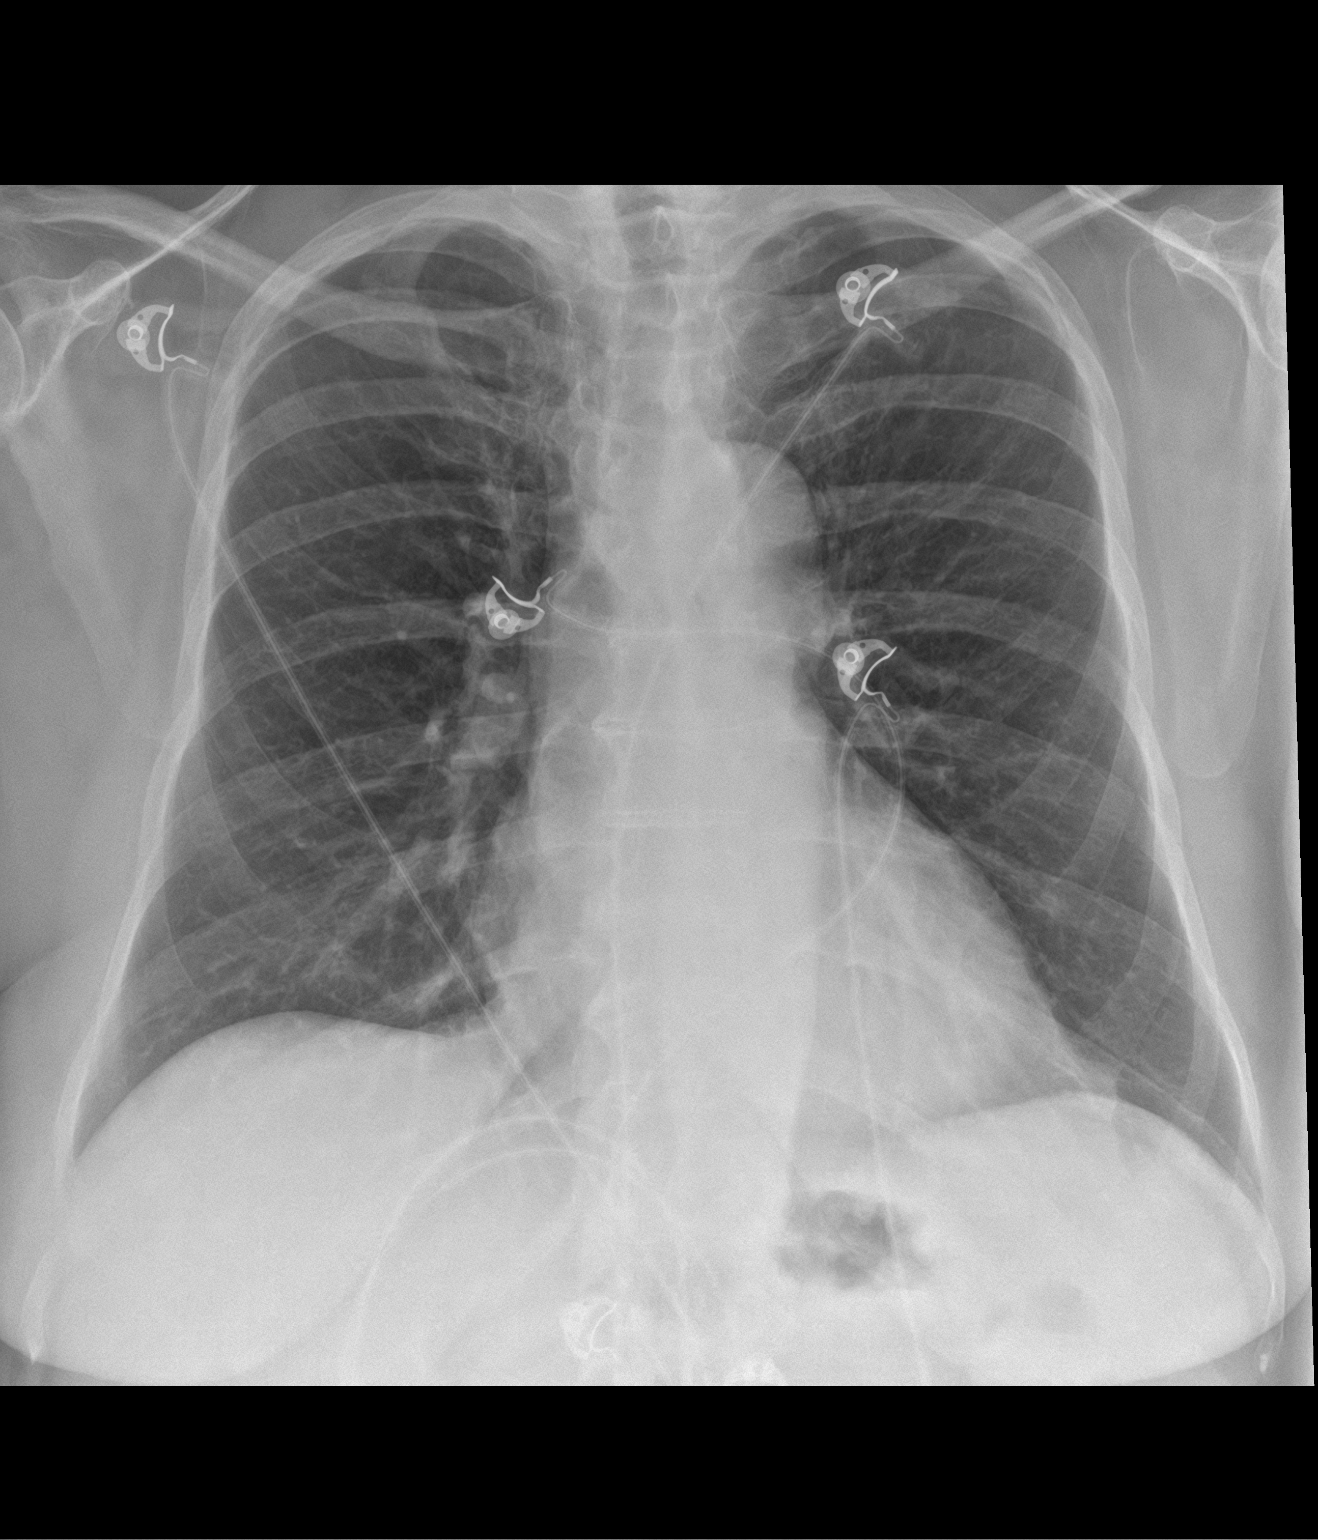

[chest lat]
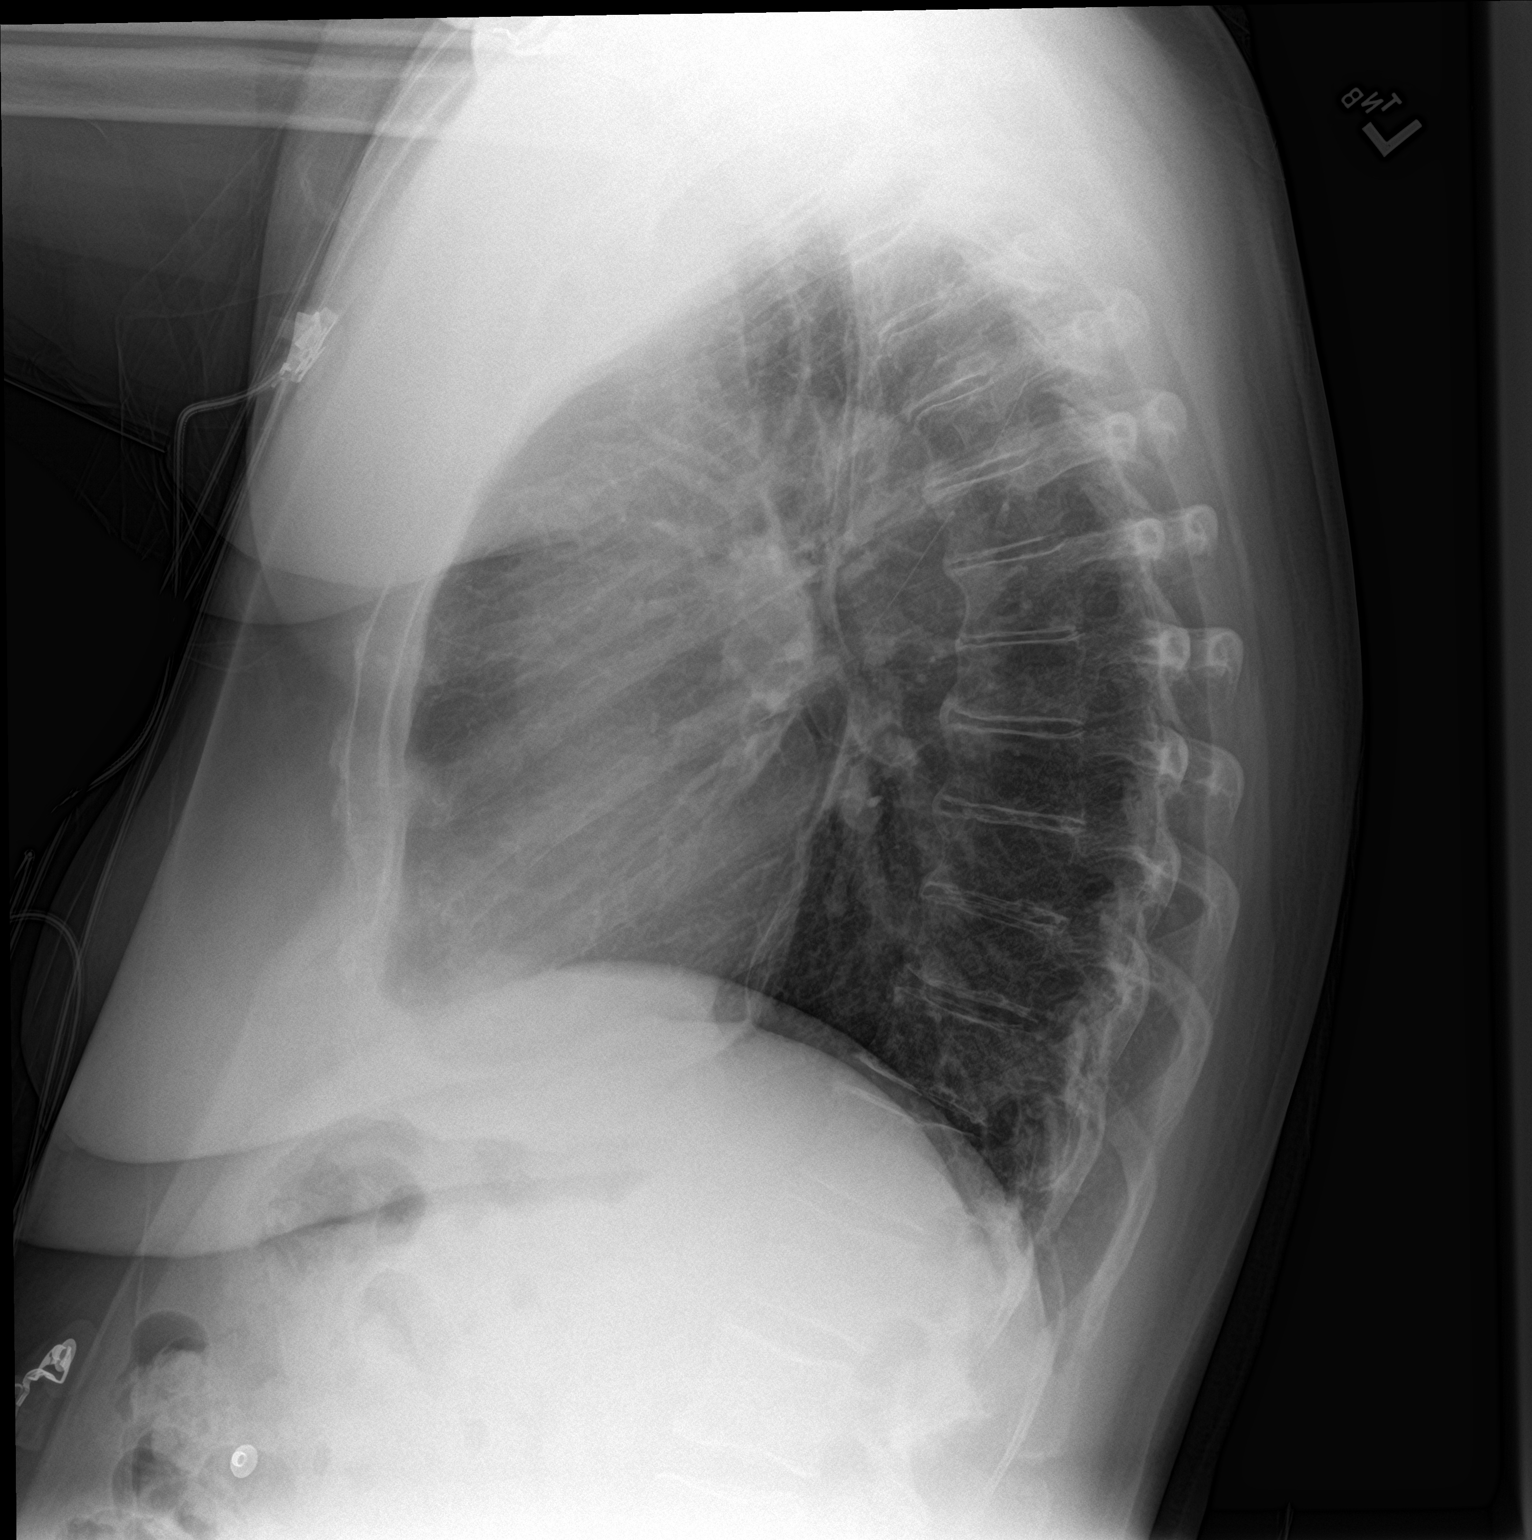

[2 of 2 positions shown; findings below may reference images not displayed]

FINDINGS: The lungs are clear. Heart size and pulmonary vascularity are
normal. No adenopathy. No pneumothorax. Mild degenerative change in
thoracic spine.
IMPRESSION: Lungs clear.  Cardiac silhouette within normal limits.

## 2022-08-30 IMAGING — CR DG CHEST 2V
2 series · 2 of 2 positions shown · non-contrast
Comparison: Radiograph 04/15/2020

CLINICAL DATA: Chest pain, hypertension

EXAM:
CHEST - 2 VIEW

[chest pa]
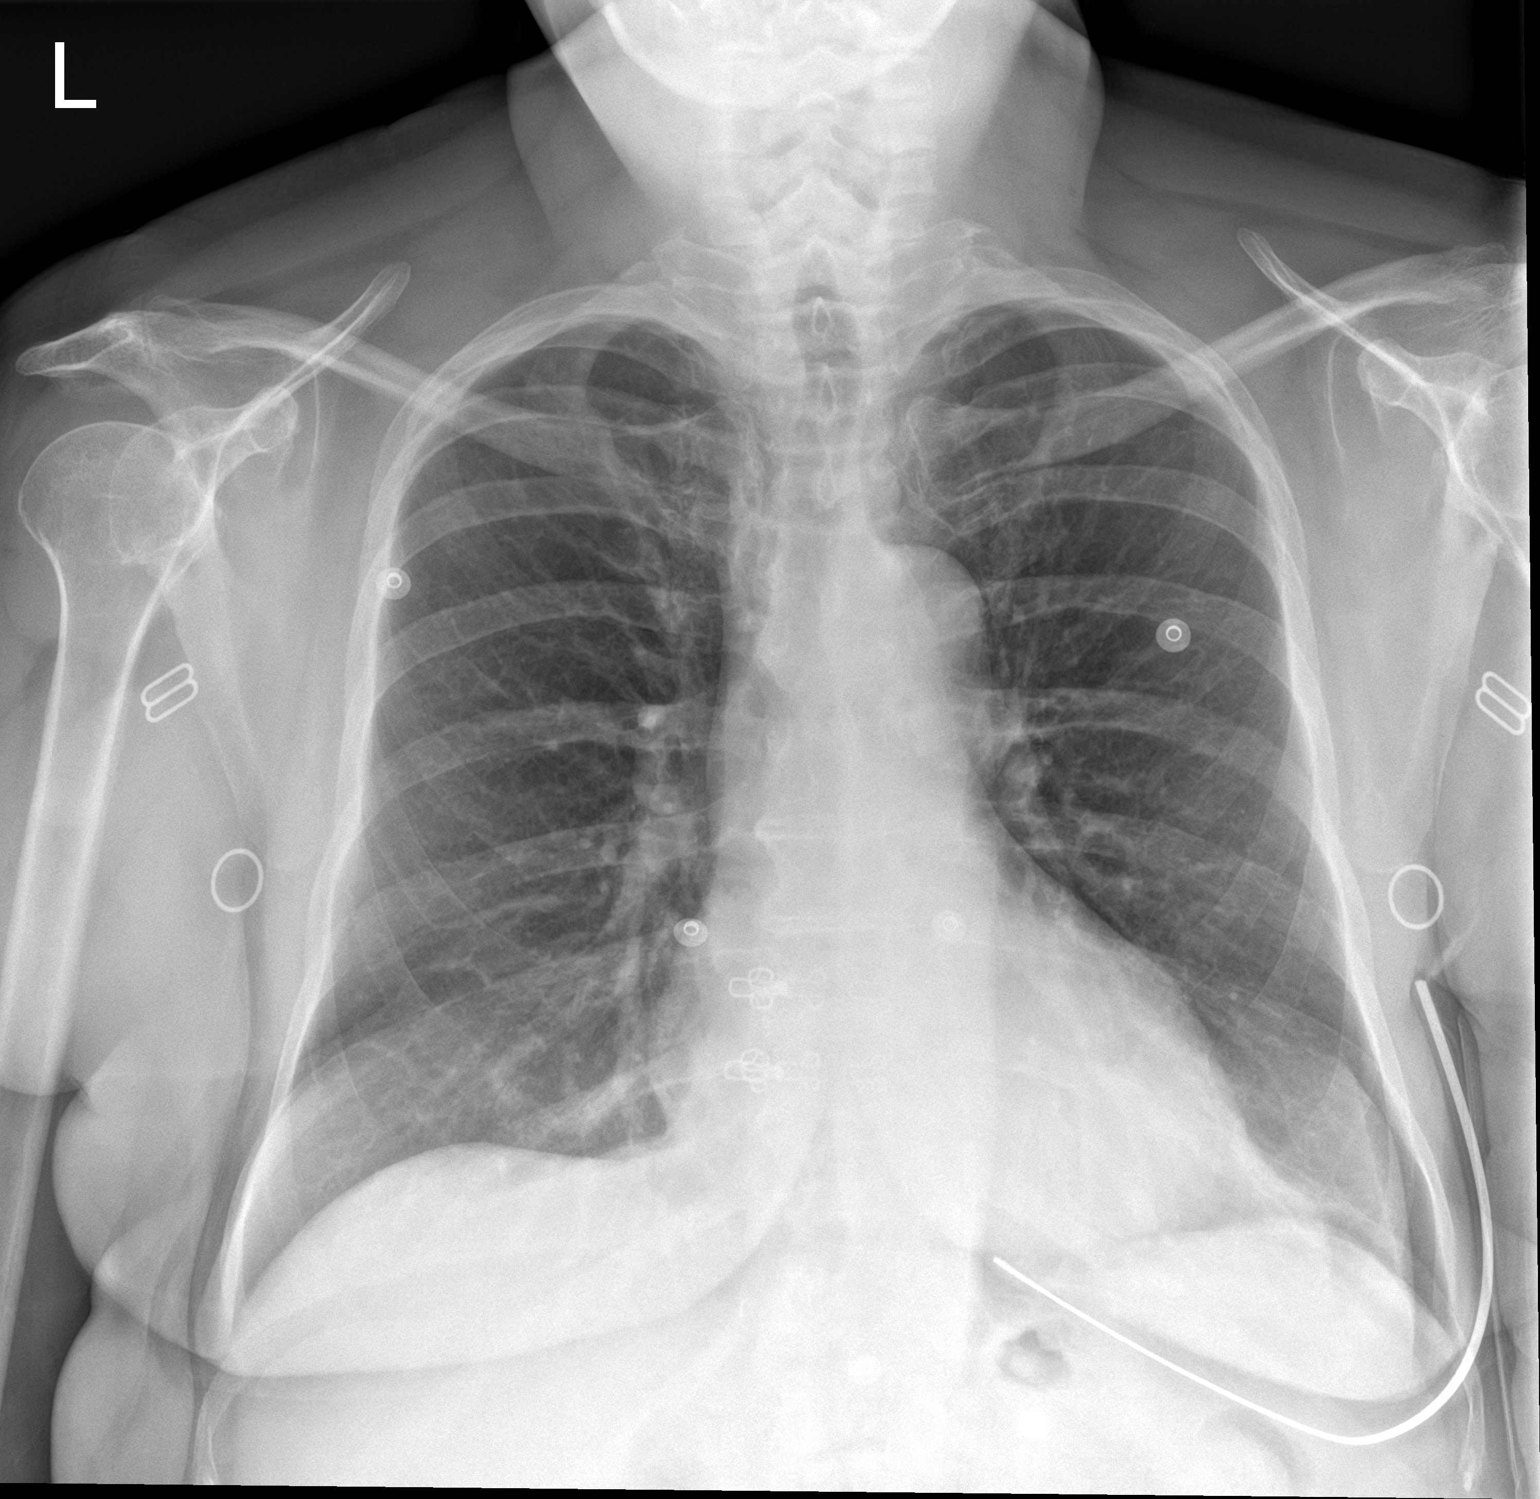

[chest lat]
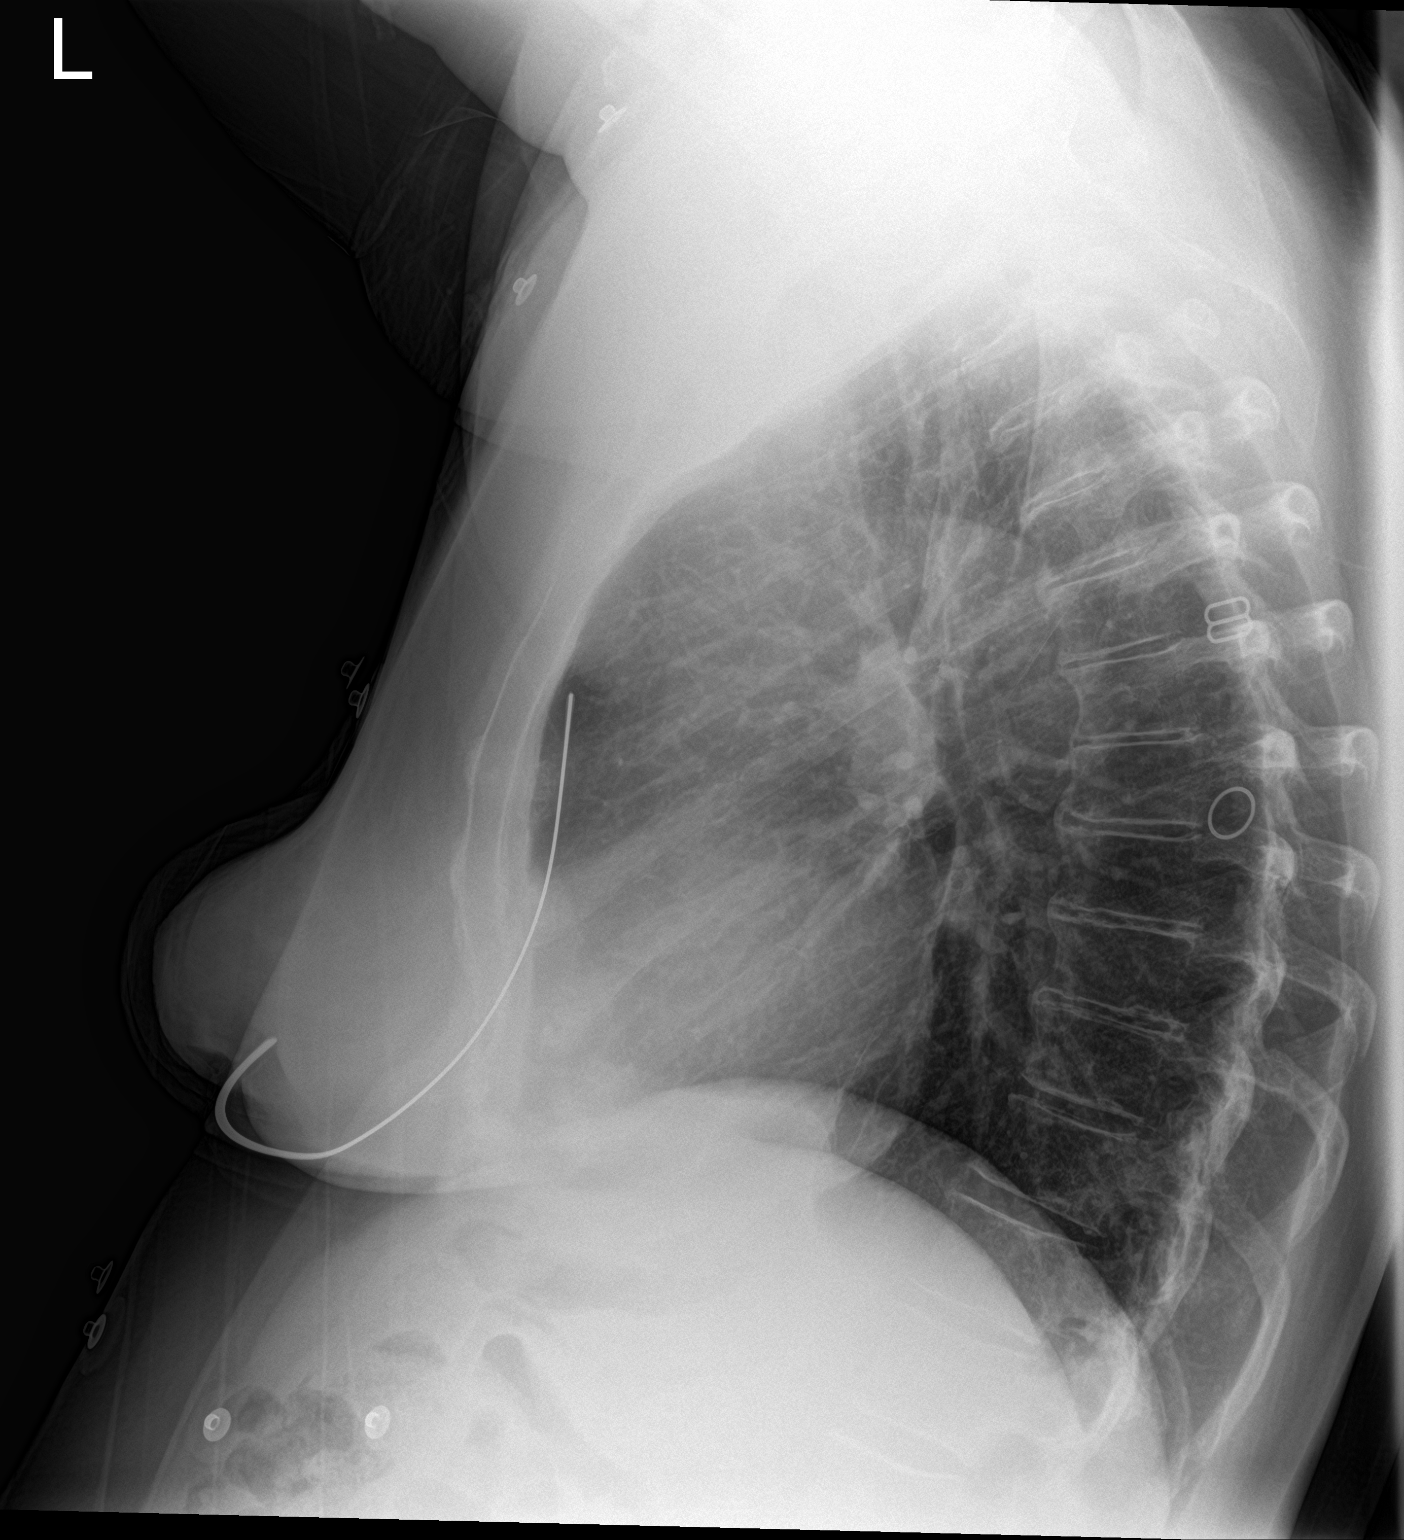

[2 of 2 positions shown; findings below may reference images not displayed]

FINDINGS: Few bandlike opacities in the lung bases likely reflect atelectasis.
No consolidation, features of edema, pneumothorax, or effusion.
Pulmonary vascularity is normally distributed. The cardiomediastinal
contours are unremarkable. No acute osseous or soft tissue
abnormality. Degenerative changes are present in the imaged spine
and shoulders.
IMPRESSION: Bibasilar atelectasis. No acute cardiopulmonary abnormality.

## 2023-10-04 ENCOUNTER — Ambulatory Visit: Payer: Self-pay | Admitting: Family Medicine

## 2023-10-04 LAB — GLUCOSE, POCT (MANUAL RESULT ENTRY): POC Glucose: 112 mg/dL — AB (ref 70–99)

## 2023-10-04 NOTE — Progress Notes (Signed)
 Nursing Intake Note Paediatric nurse Health  Chief Complaint: Lightheadedness  Living Situation: Housed Insurance Status:     New Patient Status:  [x]  New to [employer] [employer] reviewed  HIPAA form signed and documented  Consent for digital charting: [x]  Signed []  Not Signed  Additional Notes:  Vital signs taken and entered in flowsheet  Interpreter services: []  Needed [x]  Not Needed  Patient oriented to mobile clinic services and process  SDOH screening completed  Referral to provider: []  Needed [x]  Not Needed  RN Interventions Provided:  [x]  Health education (e.g., chronic disease, hygiene, nutrition)   [x]  Other: CBG
